# Patient Record
Sex: Female | Born: 1995 | Race: Black or African American | Hispanic: No | Marital: Single | Smoking: Never smoker
Health system: Southern US, Community
[De-identification: ages and names within clinical notes are randomized; demographics above are authoritative.]

## PROBLEM LIST (undated history)

## (undated) DIAGNOSIS — R200 Anesthesia of skin: Secondary | ICD-10-CM

## (undated) DIAGNOSIS — J302 Other seasonal allergic rhinitis: Secondary | ICD-10-CM

## (undated) DIAGNOSIS — R531 Weakness: Secondary | ICD-10-CM

## (undated) DIAGNOSIS — D649 Anemia, unspecified: Secondary | ICD-10-CM

## (undated) DIAGNOSIS — M549 Dorsalgia, unspecified: Secondary | ICD-10-CM

## (undated) DIAGNOSIS — R519 Headache, unspecified: Secondary | ICD-10-CM

## (undated) DIAGNOSIS — R569 Unspecified convulsions: Secondary | ICD-10-CM

## (undated) DIAGNOSIS — R51 Headache: Secondary | ICD-10-CM

## (undated) DIAGNOSIS — T7840XA Allergy, unspecified, initial encounter: Secondary | ICD-10-CM

## (undated) DIAGNOSIS — F419 Anxiety disorder, unspecified: Secondary | ICD-10-CM

## (undated) DIAGNOSIS — J329 Chronic sinusitis, unspecified: Secondary | ICD-10-CM

## (undated) DIAGNOSIS — M419 Scoliosis, unspecified: Secondary | ICD-10-CM

## (undated) DIAGNOSIS — F329 Major depressive disorder, single episode, unspecified: Secondary | ICD-10-CM

## (undated) DIAGNOSIS — F32A Depression, unspecified: Secondary | ICD-10-CM

## (undated) HISTORY — DX: Anesthesia of skin: R20.0

## (undated) HISTORY — PX: TONSILLECTOMY: SUR1361

## (undated) HISTORY — DX: Headache, unspecified: R51.9

## (undated) HISTORY — DX: Weakness: R53.1

## (undated) HISTORY — DX: Anemia, unspecified: D64.9

## (undated) HISTORY — DX: Unspecified convulsions: R56.9

## (undated) HISTORY — DX: Other seasonal allergic rhinitis: J30.2

## (undated) HISTORY — DX: Dorsalgia, unspecified: M54.9

## (undated) HISTORY — PX: ADENOIDECTOMY: SUR15

## (undated) HISTORY — DX: Anxiety disorder, unspecified: F41.9

## (undated) HISTORY — DX: Major depressive disorder, single episode, unspecified: F32.9

## (undated) HISTORY — DX: Depression, unspecified: F32.A

---
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
## 2018-02-07 ENCOUNTER — Encounter: Payer: Self-pay | Admitting: Cardiovascular Disease

## 2018-02-15 ENCOUNTER — Encounter: Payer: Self-pay | Admitting: Cardiovascular Disease

## 2018-03-13 ENCOUNTER — Ambulatory Visit: Payer: Self-pay | Admitting: Cardiology

## 2018-04-12 ENCOUNTER — Encounter: Payer: Self-pay | Admitting: *Deleted

## 2018-04-12 NOTE — Progress Notes (Signed)
Cardiology Office Note  Date: 04/13/2018   ID: Emily Arroyo, DOB 06/08/1996, MRN [id number]  PCP: [facility]  Consulting Cardiologist: Nona Dell, MD   Chief Complaint  Patient presents with  . Palpitations    History of Present Illness: Emily Arroyo is a 22 y.o. female referred for cardiology consultation by Ms. Jean Rosenthal PA-C for the evaluation of palpitations. She went to [employer] is and is now a Gaffer at [employer].  In fact, she is going back to [place] tomorrow.  She states that she has felt a sense of increased heartbeat, usually at nighttime when she is laying down, not noticeable when she is up walking around or doing yoga.  According to her Fitbit, her heart rate can be anywhere from the 90s to the low 110s.  Symptoms last for about 10 or 20 minutes and then resolve on their own.  This occurs perhaps twice weekly at this point.  She has never had any associated syncope, no chest pain, no dyspnea on exertion.  She does not report any family history of cardiac arrhythmias or sudden cardiac death.  I personally reviewed her ECG today which shows sinus rhythm with nonspecific T wave changes, normal QT interval, no delta waves.  She does not report any new medications, does not use any stimulants or energy drinks.  Denies any illicit substance use.  Past Medical History:  Diagnosis Date  . Allergy   . Anxiety and depression   . Headache(784.0)   . Scoliosis   . Sinusitis     Past Surgical History:  Procedure Laterality Date  . ADENOIDECTOMY    . TONSILLECTOMY      Current Outpatient Medications  Medication Sig Dispense Refill  . fluticasone (FLONASE) 50 MCG/ACT nasal spray Place 2 sprays into both nostrils daily.     No current facility-administered medications for this visit.    Allergies:  Zithromax [azithromycin]; Red dye; and Robaxin [methocarbamol]     Social History: The patient  reports that she has never smoked. She has never used smokeless tobacco. She reports that she drinks alcohol. She reports that she does not use drugs.   Family History: The patient's family history includes Arthritis in her maternal grandmother; Cancer in her maternal grandmother; Hearing loss in her maternal grandmother; Heart disease in her maternal grandmother; Hyperlipidemia in her maternal grandfather and maternal grandmother; Hypertension in her maternal grandmother and mother.   ROS:  Please see the history of present illness. Otherwise, complete review of systems is positive for none.  All other systems are reviewed and negative.   Physical Exam: VS:  BP 116/78 (BP Location: Right Arm)   Pulse (!) 101   Ht 5\' 2"  (1.575 m)   Wt 127 lb (57.6 kg)   SpO2 97%   BMI 23.23 kg/m , BMI Body mass index is 23.23 kg/m.  Wt Readings from Last 3 Encounters:  04/13/18 127 lb (57.6 kg)  09/29/12 107 lb (48.5 kg) (16 %, Z= -1.01)*  09/26/12 107 lb 9.6 oz (48.8 kg) (17 %, Z= -0.97)*   * Growth percentiles are based on CDC (Girls, 2-20 Years) data.    General: Patient appears comfortable at rest. HEENT: Conjunctiva and lids normal, oropharynx clear. Neck: Supple, no elevated JVP or carotid bruits, no thyromegaly. Lungs: Clear to auscultation, nonlabored breathing at rest. Cardiac: Regular rate and rhythm, no S3, 2/6 mid-to-late systolic murmur without obvious click, no  pericardial rub. Abdomen: Soft, nontender, bowel sounds present. Extremities: No pitting edema, distal pulses 2+. Skin: Warm and dry. Musculoskeletal: No kyphosis. Neuropsychiatric: Alert and oriented x3, affect grossly appropriate.  ECG: I personally reviewed the tracing from 09/29/2012 which showed sinus tachycardia with nonspecific ST-T changes.  Recent Labwork:    Component Value Date/Time   CHOL 114 10/08/2012 0615   TRIG 36 10/08/2012 0615   HDL 61 10/08/2012 0615   CHOLHDL 1.9  10/08/2012 0615   VLDL 7 10/08/2012 0615   LDLCALC 46 10/08/2012 0615  July 2019: Hemoglobin 11.3, platelets 332, BUN 9, creatinine 0.76, potassium 4.4, AST 12, ALT less than 8, cholesterol 151, triglycerides 46, HDL 78, LDL 64  Assessment and Plan:  1.  Intermittent palpitations as described above, no associated syncope, no exertional chest pain or shortness of breath.  Symptom frequency is typically twice a week at this point.  She is not on any new medications, reports no stimulant use or illicit substance use.  No history of thyroid disease or family history of recurring arrhythmias.  Would generally recommend a 7-day event monitor at this time to try to capture what she is experiencing.  Unfortunately, she is heading back to [place] tomorrow.  I have recommended that she follow-up with a healthcare provider in that area and discuss cardiac monitoring.  2.  Mid to late systolic murmur without click.  In light of her symptoms, would recommend an echocardiogram to assess cardiac structure and function.  She reports no history of mitral valve prolapse.  We will try to get the echocardiogram arranged today before she leaves town tomorrow.  Current medicines were reviewed with the patient today.   Orders Placed This Encounter  Procedures  . EKG 12-Lead  . ECHOCARDIOGRAM COMPLETE    Disposition: Call with test results.  Signed, Jonelle Sidle, MD, [facility] 04/13/2018 11:24 AM    [facility] at [facility] [address] [place], [place] [address] Phone: [phone]; Fax: [phone]

## 2018-04-13 ENCOUNTER — Encounter: Payer: Self-pay | Admitting: Cardiology

## 2018-04-13 ENCOUNTER — Ambulatory Visit (INDEPENDENT_AMBULATORY_CARE_PROVIDER_SITE_OTHER): Payer: 59 | Admitting: Cardiology

## 2018-04-13 ENCOUNTER — Ambulatory Visit (HOSPITAL_COMMUNITY)
Admission: RE | Admit: 2018-04-13 | Discharge: 2018-04-13 | Disposition: A | Discharge status: Home / Self Care | Payer: 59 | Source: Ambulatory Visit | Attending: Cardiology | Admitting: Cardiology

## 2018-04-13 VITALS — BP 116/78 | HR 101 | Ht 62.0 in | Wt 127.0 lb

## 2018-04-13 DIAGNOSIS — R002 Palpitations: Secondary | ICD-10-CM | POA: Diagnosis not present

## 2018-04-13 DIAGNOSIS — R011 Cardiac murmur, unspecified: Secondary | ICD-10-CM | POA: Diagnosis present

## 2018-04-13 DIAGNOSIS — F411 Generalized anxiety disorder: Secondary | ICD-10-CM | POA: Diagnosis not present

## 2018-04-13 DIAGNOSIS — F329 Major depressive disorder, single episode, unspecified: Secondary | ICD-10-CM | POA: Insufficient documentation

## 2018-04-13 LAB — ECHOCARDIOGRAM COMPLETE
Height: 62 in
WEIGHTICAEL: 2032 [oz_av]

## 2018-04-13 NOTE — Patient Instructions (Signed)
Your physician recommends that you schedule a follow-up appointment in:  To be determined after echo    Your physician has requested that you have an echocardiogram. Echocardiography is a painless test that uses sound waves to create images of your heart. It provides your doctor with information about the size and shape of your heart and how well your heart's chambers and valves are working. This procedure takes approximately one hour. There are no restrictions for this procedure.       Your physician recommends that you continue on your current medications as directed. Please refer to the Current Medication list given to you today.     No lab work today       Thank you for [facility] !

## 2018-04-13 NOTE — Progress Notes (Signed)
*  PRELIMINARY RESULTS* Echocardiogram 2D Echocardiogram has been performed.  Emily Arroyo 04/13/2018, 12:30 PM

## 2018-05-16 ENCOUNTER — Telehealth: Payer: Self-pay | Admitting: Cardiology

## 2018-05-16 NOTE — Telephone Encounter (Signed)
Please give pt a call concerning a monitor   [phone]

## 2018-05-17 NOTE — Telephone Encounter (Signed)
Attempted to reach,[id number] 

## 2018-05-18 NOTE — Telephone Encounter (Signed)
Called pt. Non answer, left message for pt to return call concerning monitor.

## 2019-11-08 ENCOUNTER — Ambulatory Visit (INDEPENDENT_AMBULATORY_CARE_PROVIDER_SITE_OTHER): Payer: Self-pay | Admitting: Internal Medicine

## 2019-12-10 ENCOUNTER — Encounter (INDEPENDENT_AMBULATORY_CARE_PROVIDER_SITE_OTHER): Payer: Self-pay | Admitting: Internal Medicine

## 2019-12-10 ENCOUNTER — Ambulatory Visit (INDEPENDENT_AMBULATORY_CARE_PROVIDER_SITE_OTHER): Payer: 59 | Admitting: Internal Medicine

## 2019-12-10 VITALS — BP 123/76 | HR 85 | Temp 98.4°F | Resp 18 | Ht 62.0 in | Wt 134.6 lb

## 2019-12-10 DIAGNOSIS — Z0000 Encounter for general adult medical examination without abnormal findings: Secondary | ICD-10-CM

## 2019-12-10 DIAGNOSIS — Z87898 Personal history of other specified conditions: Secondary | ICD-10-CM | POA: Insufficient documentation

## 2019-12-10 DIAGNOSIS — J302 Other seasonal allergic rhinitis: Secondary | ICD-10-CM | POA: Insufficient documentation

## 2019-12-10 MED ORDER — AZELASTINE HCL 0.1 % NA SOLN
2.00 | Freq: Two times a day (BID) | NASAL | 1 refills | Status: DC
Start: 2019-12-10 — End: 2021-05-03

## 2019-12-10 NOTE — Progress Notes (Signed)
Subjective:      Date: 12/10/2019 12:39 PM   Patient ID: Sarah Ball is a 24 y.o. female.    Chief Complaint:  Chief Complaint   Patient presents with    Annual Exam     not fasting       HPI  Visit Type: Health Maintenance Visit  Work Status: working full-time  Reported Health: good health   Reported Diet: vegetarian x4-52yrs  Reported Exercise: 4x/week  Dental: regular dental visits twice a year  Vision: glasses, contact lenses and regular eye exams   Hearing: normal hearing  Immunization Status: immunizations up to date  Reproductive Health: not currently sexually active and premenopausal  Prior Screening Tests: last pap smear in 2019  General Health Risks: family history of prostate cancer, no family history of colon cancer and family history of breast cancer  Safety Elements Used: uses seat belts and smoke detectors in household  Reports seasonal allergies and that she takes Flonase which has in the past worked well for her but no longer working.  Reports she only takes Claritin/antihistamines as needed.  Problem List:  Patient Active Problem List   Diagnosis    Seasonal allergies    History of palpitations       Current Medications:  Current Outpatient Medications   Medication Sig Dispense Refill    UNABLE TO FIND fluticasone Metered Dose Nasal Spray [Flonase] 2 sprays in each nostril daily      azelastine (ASTELIN) 0.1 % nasal spray 2 sprays by Nasal route 2 (two) times daily 2 sprays each nostril twice a day as needed 30 mL 1     No current facility-administered medications for this visit.       Allergies:  Allergies   Allergen Reactions    Azithromycin Anaphylaxis    Phenytoin Anaphylaxis    Other Nausea And Vomiting and Headaches     Red Dye    Magnesium Rash       Past Medical History:  Past Medical History:   Diagnosis Date    Anti-NMDA receptor encephalitis 2014       Past Surgical History:  Past Surgical History:   Procedure Laterality Date    TONSILLECTOMY         Family History:  Family  History   Problem Relation Age of Onset    Hypertension Mother     Cancer Maternal Grandmother         breast ca    Hypertension Maternal Grandmother     Cancer Maternal Grandfather         prostate    Hypertension Maternal Grandfather        Social History:  Social History     Socioeconomic History    Marital status: Single     Spouse name: Not on file    Number of children: Not on file    Years of education: Not on file    Highest education level: Not on file   Occupational History    Not on file   Tobacco Use    Smoking status: Never Smoker    Smokeless tobacco: Never Used   Substance and Sexual Activity    Alcohol use: Yes     Comment: socially    Drug use: Never    Sexual activity: Not Currently   Other Topics Concern    Not on file   Social History Narrative    Not on file     Social Determinants of Health  Financial Resource Strain:     Difficulty of Paying Living Expenses:    Food Insecurity:     Worried About Programme researcher, broadcasting/film/video in the Last Year:     Barista in the Last Year:    Transportation Needs:     Freight forwarder (Medical):     Lack of Transportation (Non-Medical):    Physical Activity:     Days of Exercise per Week:     Minutes of Exercise per Session:    Stress:     Feeling of Stress :    Social Connections:     Frequency of Communication with Friends and Family:     Frequency of Social Gatherings with Friends and Family:     Attends Religious Services:     Active Member of Clubs or Organizations:     Attends Banker Meetings:     Marital Status:    Intimate Partner Violence:     Fear of Current or Ex-Partner:     Emotionally Abused:     Physically Abused:     Sexually Abused:        The following sections were reviewed this encounter by the provider:   Tobacco   Allergies   Meds   Problems   Med Hx   Surg Hx   Fam Hx          ROS:   General/Constitutional:   Denies Change in appetite. Denies Chills. Denies Fatigue. Denies Fever.    Ophthalmologic:   Denies Blurred vision. Denies Eye Discharge. Denies Eye Pain.   ENT:   Denies Nasal Discharge. Denies Hoarseness. Denies Ear pain. Denies Nosebleed. Denies Sinus pain. Denies Sore throat.   Endocrine:   Denies Polydipsia. Denies Polyuria. Denies Weakness.   Respiratory:   Denies Paroxysmal Nocturnal Dyspnea. Denies Cough. Denies Orthopnea. Denies Shortness of breath. Denies Wheezing.   Cardiovascular:   Denies Chest pain. Denies Chest pain with exertion. Denies Leg Claudication.. Denies Swelling in hands/feet.   Gastrointestinal:   Denies Abdominal pain. Denies Blood in stool. Denies Constipation. Denies Diarrhea. Denies Heartburn. Denies Nausea. Denies Vomiting.   Hematology:   Denies Easy bruising. Denies Easy Bleeding. Denies Swollen glands.   Genitourinary:   Denies Blood in urine. Denies Nocturia. Denies Painful urination.   Musculoskeletal:   Denies Joint pain. Denies Joint stiffness. Denies Leg cramps. Denies Muscle aches. Denies Weakness in LE. Denies Swollen joints. Denies Weakness in UE.   Skin:   Denies Itching.Denies Rash.   Neurologic:   Denies Balance difficulty. Denies Dizziness. Denies Gait abnormality. Denies Headache. Denies Pre-Syncope. Denies Memory loss. Denies Seizures. Denies Tingling/Numbness.   Psychiatric:   Denies Anxiety. Denies Depressed mood. Denies Difficulty sleeping.     Objective:     Vitals:  BP 123/76 (BP Site: Right arm, Patient Position: Sitting, Cuff Size: Medium)    Pulse 85    Temp 98.4 F (36.9 C) (Oral)    Resp 18    Ht 1.575 m (5\' 2" )    Wt 61.1 kg (134 lb 9.6 oz)    LMP 11/28/2019 (Exact Date)    SpO2 99%    BMI 24.62 kg/m     Examination:   General Examination:   GENERAL APPEARANCE: alert, in no acute distress, well developed, well nourished, oriented to time, place, and person.   HEAD: normal appearance, atraumatic.   EYES: extraocular movement intact (EOMI), pupils equal, round, reactive to light and accommodation, sclera anicteric, conjunctiva  clear.  EARS: tympanic membranes normal bilaterally, external canals normal .   NOSE: nares patent..   ORAL CAVITY: normal oropharynx, normal lips, mucosa moist, no lesions.   THROAT: normal appearance, clear, no erythema.   NECK/THYROID: neck supple, no carotid bruit, carotid pulse 2+ bilaterally, no cervical lymphadenopathy, no neck mass palpated, no jugular venous distention, no thyromegaly.   LYMPH NODES: no palpable adenopathy.   SKIN: good turgor, no rashes, no suspicious lesions.   HEART: S1, S2 normal, no murmurs, rubs, gallops, regular rate and rhythm.   LUNGS: normal effort / no distress, normal breath sounds, clear to auscultation bilaterally, no wheezes, rales, rhonchi.   ABDOMEN: bowel sounds present, no hepatosplenomegaly, soft, nontender, nondistended.   FEMALE GENITOURINARY: Deferred, up-to-date  MUSCULOSKELETAL: full range of motion, no swelling or deformity.   EXTREMITIES: no edema, no clubbing, cyanosis, or edema.   PERIPHERAL PULSES: 2+ dorsalis pedis, 2+ posterior tibial.   NEUROLOGIC: nonfocal, cranial nerves 2-12 grossly intact, deep tendon reflexes 2+ symmetrical, normal strength, tone and reflexes, sensory exam intact.   PSYCH: cognitive function intact, mood/affect full range, speech clear.     Assessment/Plan:       1. Well adult exam  - CBC and differential; Future  - Comprehensive metabolic panel; Future  - TSH; Future  - T4, free; Future  - Lipid panel; Future  - Vitamin B12; Future  - Hemoglobin A1C; Future  - Vitamin D,25 OH, Total; Future    2. Seasonal allergies  - azelastine (ASTELIN) 0.1 % nasal spray; 2 sprays by Nasal route 2 (two) times daily 2 sprays each nostril twice a day as needed  Dispense: 30 mL; Refill: 1      Health Maintenance:  Immunizations UTD. Vision screening UTD. Dental Screening UTD. Cervical cancer screening is UTD.    Return for schedule follow up after labs as discussed, annual in 59yr.Kela Millin, MD

## 2019-12-10 NOTE — Patient Instructions (Signed)
It was a pleasure seeing you at the office today!    Prevention Guidelines,Women Ages 18 to 39  Screening tests and vaccines are an important part of managing your health. A screening test is done to find possible disorders or diseases in people who don't have any symptoms. The goal is to find a disease early so lifestyle changes can be made and you can be watched more closely to reduce the risk of disease, or to detect it early enough to treat it most effectively. Screening tests are not considered diagnostic, but are used to determine if more testing is needed. Health counseling is essential, too. Below are guidelines for these, for women ages 18 to 39. Talk with your healthcare provider to make sure you're up-to-date on what you need.   Screening Who needs it How often   Alcohol misuse All women in this age group  At routine exams   Blood pressure All women in this age group  Yearly checkup if your blood pressure is normal   Normal blood pressure is less than 120/80 mm Hg   If your blood pressure reading is higher than normal, follow the advice of your healthcare provider    Breast cancer All women in this age group should talk with their healthcare providers about the need for clinical breast exams (CBE)1  Clinical breast exam every 3 years1    Cervical cancer Women ages 21 and older  Women between ages 21 and 29 should have a Pap test every 3 years; women between ages 30 and 65 are advised to have a Pap test plus an HPV test every 5 years    Chlamydia Sexually active women ages 24 and younger, and women at increased risk for infection  Every 3 years if you're at risk or have symptoms    Depression All women in this age group  At routine exams   Diabetes mellitus, type 2  Adults with no symptoms who are overweight or obese and have 1 or more other risk factors for diabetes  At least every 3 years. Also, testing for diabetes during pregnancy after the 24th week.    Gonorrhea Sexually active women at increased  risk for infection  At routine exams   Hepatitis C Anyone at increased risk  At routine exams   HIV All women At routine exams3    Obesity All women in this age group  At routine exams   Syphilis Women at increased risk for infection should talk with their healthcare provider  At routine exams   Tuberculosis Women at increased risk for infection should talk with their healthcare provider  Ask your healthcare provider    Vision All women in this age group  At least 1 complete exam in your 20s, and 2 in your 30s    Vaccine Who needs it How often   Chickenpox (varicella)  All women in this age group who have no record of this infection or vaccine  2 doses; the second dose should be given 4 to 8 weeks after the first dose    Hepatitis A Women at increased risk for infection should talk with their healthcare provider  2 doses given at least 6 months apart    Hepatitis B Women at increased risk for infection should talk with their healthcare provider  3 doses over 6 months; second dose should be given 1 month after the first dose; the third dose should be given at least 2 months after the second dose   and at least 4 months after the first dose    Haemophilus influenzae Type B (HIB)  Women at increased risk for infection should talk with their healthcare provider  1 to 3 doses   Human papillomavirus (HPV)  All women in this age group up to age 26  3 doses; the second dose should be given 1 to 2 months after the first dose and the third dose given 6 months after the first dose    Influenza (flu) All women in this age group  Once a year   Measles, mumps, rubella (MMR)  All women in this age group who have no record of these infections or vaccines  1 or 2 doses   Meningococcal Women at increased risk for infection should talk with their healthcare provider  1 or more doses   Pneumococcal conjugate vaccine (PCV13)and pneumococcal polysaccharidevaccine(PPSV23)  Women at increased risk for infection should talk with their  healthcare provider  PCV13: 1 dose ages 19 to 65 (protects against 13 types of pneumococcal bacteria)   PPSV23: 1 to2 doses through age 64, or 1 dose at 65 or older (protects against 23 types of pneumococcal bacteria)    Tetanus/diphtheria/pertussis (Td/Tdap) booster All women in this age group  Td every 10 years, or a one-time dose of Tdap instead of a Td booster after age 18, then Td every 10 years    Counseling Who needs it How often   BRCA gene mutation testing for breast and ovarian cancer susceptibility  Women with increased risk for having gene mutation  When your risk is known    Breast cancer and chemoprevention  Women at high risk for breast cancer  When your risk is known    Diet and exercise Women who are overweight or obese  When diagnosed, and then at routine exams    Domestic violence Women at the age in which they are able to have children  At routine exams   Sexually transmitted infection prevention  Women who are sexually active  At routine exams   Skin cancer Prevention of skin cancer in fair-skinned adults  At routine exams   Use of tobacco and the health effects it can cause  All women in this age group  Every visit   1 According to the ACS, women ages 20 to 39 years should have a clinical breast exam (CBE) as part of their routine health exam every 3 years. Breast self-exams are an option for women starting in their 20s.But the U.S. Preventive Services Task Force (USPSTF) does not recommend CBE.   2 Those who are 25 years old and not up-to-date on their childhood vaccines should get all appropriate catch-up vaccines recommended by the CDC.   3 The USPSTF recommends that all people ages 15 to 65 years be screened for HIV and those younger or older people at increased risk. The CDC recommends that everyone between the ages of 13 and 64 get tested for HIV at least once as part of routine health care.   [employer] last reviewed this educational content on 04/17/2016   2000-2020 [employer] [address], [place] [address]. All rights reserved. This information is not intended as a substitute for professional medical care. Always follow your healthcare professional's instructions.

## 2019-12-10 NOTE — Progress Notes (Signed)
Have you seen any specialists/other providers since your last visit with us?    New patient    Arm preference verified?   Yes    The patient is due for nothing at this time, HM is up-to-date.

## 2019-12-13 ENCOUNTER — Other Ambulatory Visit (FREE_STANDING_LABORATORY_FACILITY): Payer: 59

## 2019-12-13 DIAGNOSIS — Z0000 Encounter for general adult medical examination without abnormal findings: Secondary | ICD-10-CM

## 2019-12-13 LAB — COMPREHENSIVE METABOLIC PANEL
ALT: 9 U/L (ref 0–55)
AST (SGOT): 13 U/L (ref 5–34)
Albumin/Globulin Ratio: 1.4 (ref 0.9–2.2)
Albumin: 4.1 g/dL (ref 3.5–5.0)
Alkaline Phosphatase: 52 U/L (ref 37–106)
Anion Gap: 10 (ref 5.0–15.0)
BUN: 8 mg/dL (ref 7.0–19.0)
Bilirubin, Total: 0.6 mg/dL (ref 0.2–1.2)
CO2: 23 mEq/L (ref 21–29)
Calcium: 9.5 mg/dL (ref 8.5–10.5)
Chloride: 106 mEq/L (ref 100–111)
Creatinine: 0.8 mg/dL (ref 0.4–1.5)
Globulin: 2.9 g/dL (ref 2.0–3.7)
Glucose: 88 mg/dL (ref 70–100)
Potassium: 4.1 mEq/L (ref 3.5–5.1)
Protein, Total: 7 g/dL (ref 6.0–8.3)
Sodium: 139 mEq/L (ref 136–145)

## 2019-12-13 LAB — CBC AND DIFFERENTIAL
Absolute NRBC: 0 10*3/uL (ref 0.00–0.00)
Basophils Absolute Automated: 0.04 10*3/uL (ref 0.00–0.08)
Basophils Automated: 0.8 %
Eosinophils Absolute Automated: 0.02 10*3/uL (ref 0.00–0.44)
Eosinophils Automated: 0.4 %
Hematocrit: 35.9 % (ref 34.7–43.7)
Hgb: 10.8 g/dL — ABNORMAL LOW (ref 11.4–14.8)
Immature Granulocytes Absolute: 0.01 10*3/uL (ref 0.00–0.07)
Immature Granulocytes: 0.2 %
Lymphocytes Absolute Automated: 1.62 10*3/uL (ref 0.42–3.22)
Lymphocytes Automated: 32.5 %
MCH: 25.1 pg (ref 25.1–33.5)
MCHC: 30.1 g/dL — ABNORMAL LOW (ref 31.5–35.8)
MCV: 83.5 fL (ref 78.0–96.0)
MPV: 11.3 fL (ref 8.9–12.5)
Monocytes Absolute Automated: 0.29 10*3/uL (ref 0.21–0.85)
Monocytes: 5.8 %
Neutrophils Absolute: 3 10*3/uL (ref 1.10–6.33)
Neutrophils: 60.3 %
Nucleated RBC: 0 /100 WBC (ref 0.0–0.0)
Platelets: 369 10*3/uL — ABNORMAL HIGH (ref 142–346)
RBC: 4.3 10*6/uL (ref 3.90–5.10)
RDW: 15 % (ref 11–15)
WBC: 4.98 10*3/uL (ref 3.10–9.50)

## 2019-12-13 LAB — HEMOLYSIS INDEX: Hemolysis Index: 7 (ref 0–18)

## 2019-12-13 LAB — LIPID PANEL
Cholesterol / HDL Ratio: 2.2
Cholesterol: 151 mg/dL (ref 0–199)
HDL: 70 mg/dL (ref 40–9999)
LDL Calculated: 74 mg/dL (ref 0–99)
Triglycerides: 36 mg/dL (ref 34–149)
VLDL Calculated: 7 mg/dL — ABNORMAL LOW (ref 10–40)

## 2019-12-13 LAB — VITAMIN B12: Vitamin B-12: 504 pg/mL (ref 211–911)

## 2019-12-13 LAB — HEMOGLOBIN A1C
Average Estimated Glucose: 102.5 mg/dL
Hemoglobin A1C: 5.2 % (ref 4.6–5.9)

## 2019-12-13 LAB — VITAMIN D,25 OH,TOTAL: Vitamin D, 25 OH, Total: 34 ng/mL (ref 30–100)

## 2019-12-13 LAB — GFR: EGFR: >60

## 2019-12-13 LAB — TSH: TSH: 0.87 u[IU]/mL (ref 0.35–4.94)

## 2019-12-13 LAB — T4, FREE: T4 Free: 1.06 ng/dL (ref 0.70–1.48)

## 2019-12-23 ENCOUNTER — Ambulatory Visit (INDEPENDENT_AMBULATORY_CARE_PROVIDER_SITE_OTHER): Payer: 59 | Admitting: Internal Medicine

## 2019-12-23 ENCOUNTER — Encounter (INDEPENDENT_AMBULATORY_CARE_PROVIDER_SITE_OTHER): Payer: Self-pay | Admitting: Internal Medicine

## 2019-12-23 ENCOUNTER — Encounter (INDEPENDENT_AMBULATORY_CARE_PROVIDER_SITE_OTHER): Payer: Self-pay

## 2019-12-23 ENCOUNTER — Telehealth (INDEPENDENT_AMBULATORY_CARE_PROVIDER_SITE_OTHER): Payer: 59 | Admitting: Internal Medicine

## 2019-12-23 DIAGNOSIS — D649 Anemia, unspecified: Secondary | ICD-10-CM

## 2019-12-23 DIAGNOSIS — N946 Dysmenorrhea, unspecified: Secondary | ICD-10-CM

## 2019-12-23 DIAGNOSIS — R002 Palpitations: Secondary | ICD-10-CM

## 2019-12-23 MED ORDER — VITRON-C 65-125 MG PO TABS
1.0000 | ORAL_TABLET | Freq: Two times a day (BID) | ORAL | 0 refills | Status: DC
Start: 2019-12-23 — End: 2021-01-12

## 2019-12-23 NOTE — Progress Notes (Addendum)
Have you seen any specialists/other providers since your last visit with us?    No        The patient is due for   PAP SMEAR  HPV Series (1 - 2-dose series)

## 2019-12-23 NOTE — Progress Notes (Signed)
Subjective:      Date: 12/23/2019 2:45 PM   Patient ID: Sarah Ball is a 24 y.o. female.    Chief Complaint:  Chief Complaint   Patient presents with    follow up to labs       HPI:  HPI   Verbal consent has been obtained from the patient to conduct a video and telephone visit to minimize exposure to COVID-19: yes  Patient is a 24 year old female presenting for follow-up of intermittent palpitations and results.  She reports that her periods  Usually 6 days long, and that when she was younger she was told she had anemia but never 'formally' diagnosed.  She admits to cramping with her periods. She states she does not take any supplements.  She denies dizziness chest pain and no shortness of breath.    Problem List:  Patient Active Problem List   Diagnosis    Seasonal allergies    History of palpitations       Current Medications:  Outpatient Medications Marked as Taking for the 12/23/19 encounter (Telemedicine Visit) with Kela Millin, MD   Medication Sig Dispense Refill    UNABLE TO FIND fluticasone Metered Dose Nasal Spray [Flonase] 2 sprays in each nostril daily         Allergies:  Allergies   Allergen Reactions    Azithromycin Anaphylaxis    Phenytoin Anaphylaxis    Other Nausea And Vomiting and Headaches     Red Dye    Magnesium Rash    Red Dye Nausea And Vomiting and Other (See Comments)       Past Medical History:  Past Medical History:   Diagnosis Date    Anti-NMDA receptor encephalitis 2014       Past Surgical History:  Past Surgical History:   Procedure Laterality Date    TONSILLECTOMY         Family History:  Family History   Problem Relation Age of Onset    Hypertension Mother     Cancer Maternal Grandmother         breast ca    Hypertension Maternal Grandmother     Cancer Maternal Grandfather         prostate    Hypertension Maternal Grandfather        Social History:  Social History     Socioeconomic History    Marital status: Single     Spouse name: Not on file    Number of  children: Not on file    Years of education: Not on file    Highest education level: Not on file   Occupational History    Not on file   Tobacco Use    Smoking status: Never Smoker    Smokeless tobacco: Never Used   Substance and Sexual Activity    Alcohol use: Yes     Comment: socially    Drug use: Never    Sexual activity: Not Currently   Other Topics Concern    Not on file   Social History Narrative    Not on file     Social Determinants of Health     Financial Resource Strain:     Difficulty of Paying Living Expenses:    Food Insecurity:     Worried About Radiation protection practitioner of Food in the Last Year:     Barista in the Last Year:    Transportation Needs:     Freight forwarder (Medical):  Lack of Transportation (Non-Medical):    Physical Activity:     Days of Exercise per Week:     Minutes of Exercise per Session:    Stress:     Feeling of Stress :    Social Connections:     Frequency of Communication with Friends and Family:     Frequency of Social Gatherings with Friends and Family:     Attends Religious Services:     Active Member of Clubs or Organizations:     Attends Banker Meetings:     Marital Status:    Intimate Partner Violence:     Fear of Current or Ex-Partner:     Emotionally Abused:     Physically Abused:     Sexually Abused:        The following sections were reviewed this encounter by the provider:   Tobacco   Allergies   Meds   Problems   Med Hx   Surg Hx   Fam Hx          ROS:  General/Constitutional:   Denies Chills. Denies Fatigue. Denies Fever.   Ophthalmologic:   Denies Blurred vision. Denies Eye Pain.   ENT:   Denies Nasal Discharge. Denies Ear pain. Denies Sinus pain.   Endocrine:   Denies Polydipsia. Denies Polyuria.   Respiratory:   Denies Cough. Denies Orthopnea. Denies Shortness of breath. Denies Wheezing.   Cardiovascular:   Denies Chest pain. Denies Chest pain with exertion. Denies Leg Claudication. Denies Palpitations. Denies Swelling  in hands/feet.   Gastrointestinal:    As above. Denies Blood in stool. Denies Constipation. Denies Diarrhea. Denies Heartburn. Denies Nausea. Denies Vomiting.   Genitourinary:   Denies Blood in urine. Denies Frequent urination. Denies Painful urination.   Musculoskeletal:   Denies Leg cramps. Denies Muscle aches.   Skin:   Denies Skin lesion(s).   Neurologic:   Denies Dizziness. Denies Gait abnormality. Denies Headache. Denies Tingling/Numbness.     Objective:   Vitals:  LMP 11/28/2019 (Exact Date)       Physical Exam:  General Examination:   Physical Exam   GENERAL APPEARANCE: alert, in no acute distress, well developed, well nourished  oriented to time, place, and person.   PSYCH: alert and oriented to time,place and person.   SKIN: moist, no focal rash  Recent Results (from the past 1008 hour(s))   CBC and differential    Collection Time: 12/13/19  9:42 AM   Result Value Ref Range    WBC 4.98 3.10 - 9.50 x10 3/uL    Hgb 10.8 (L) 11.4 - 14.8 g/dL    Hematocrit 16.1 09.6 - 43.7 %    Platelets 369 (H) 142 - 346 x10 3/uL    RBC 4.30 3.90 - 5.10 x10 6/uL    MCV 83.5 78.0 - 96.0 fL    MCH 25.1 25.1 - 33.5 pg    MCHC 30.1 (L) 31.5 - 35.8 g/dL    RDW 15 11 - 15 %    MPV 11.3 8.9 - 12.5 fL    Neutrophils 60.3 None %    Lymphocytes Automated 32.5 None %    Monocytes 5.8 None %    Eosinophils Automated 0.4 None %    Basophils Automated 0.8 None %    Immature Granulocytes 0.2 None %    Nucleated RBC 0.0 0.0 - 0.0 /100 WBC    Neutrophils Absolute 3.00 1.10 - 6.33 x10 3/uL    Lymphocytes Absolute Automated 1.62 0.42 -  3.22 x10 3/uL    Monocytes Absolute Automated 0.29 0.21 - 0.85 x10 3/uL    Eosinophils Absolute Automated 0.02 0.00 - 0.44 x10 3/uL    Basophils Absolute Automated 0.04 0.00 - 0.08 x10 3/uL    Immature Granulocytes Absolute 0.01 0.00 - 0.07 x10 3/uL    Absolute NRBC 0.00 0.00 - 0.00 x10 3/uL   Comprehensive metabolic panel    Collection Time: 12/13/19  9:42 AM   Result Value Ref Range    Glucose 88 70 - 100 mg/dL     BUN 8.0 7.0 - 16.1 mg/dL    Creatinine 0.8 0.4 - 1.5 mg/dL    Sodium 096 045 - 409 mEq/L    Potassium 4.1 3.5 - 5.1 mEq/L    Chloride 106 100 - 111 mEq/L    CO2 23 21 - 29 mEq/L    Calcium 9.5 8.5 - 10.5 mg/dL    Protein, Total 7.0 6.0 - 8.3 g/dL    Albumin 4.1 3.5 - 5.0 g/dL    AST (SGOT) 13 5 - 34 U/L    ALT 9 0 - 55 U/L    Alkaline Phosphatase 52 37 - 106 U/L    Bilirubin, Total 0.6 0.2 - 1.2 mg/dL    Globulin 2.9 2.0 - 3.7 g/dL    Albumin/Globulin Ratio 1.4 0.9 - 2.2    Anion Gap 10.0 5.0 - 15.0   TSH    Collection Time: 12/13/19  9:42 AM   Result Value Ref Range    TSH 0.87 0.35 - 4.94 uIU/mL   T4, free    Collection Time: 12/13/19  9:42 AM   Result Value Ref Range    T4 Free 1.06 0.70 - 1.48 ng/dL   Lipid panel    Collection Time: 12/13/19  9:42 AM   Result Value Ref Range    Cholesterol 151 0 - 199 mg/dL    Triglycerides 36 34 - 149 mg/dL    HDL 70 40 - 8,119 mg/dL    LDL Calculated 74 0 - 99 mg/dL    VLDL Calculated 7 (L) 10 - 40 mg/dL    Cholesterol / HDL Ratio 2.2 See Below   Vitamin B12    Collection Time: 12/13/19  9:42 AM   Result Value Ref Range    Vitamin B-12 504 211 - 911 pg/mL   Hemoglobin A1C    Collection Time: 12/13/19  9:42 AM   Result Value Ref Range    Hemoglobin A1C 5.2 4.6 - 5.9 %    Average Estimated Glucose 102.5 mg/dL   Vitamin J,47 OH, Total    Collection Time: 12/13/19  9:42 AM   Result Value Ref Range    Vitamin D, 25 OH, Total 34 30 - 100 ng/mL   Hemolysis index    Collection Time: 12/13/19  9:42 AM   Result Value Ref Range    Hemolysis Index 7 0 - 18   GFR    Collection Time: 12/13/19  9:42 AM   Result Value Ref Range    EGFR >60.0        Assessment:       1. Normocytic anemia  - iron-vitamin C (Vitron-C) 65-125 MG Tab; Take 1 tablet by mouth 2 (two) times daily  Dispense: 180 tablet; Refill: 0  -All above results reviewed with patient and questions answered to her satisfaction.  - US Transvaginal Only Non-OB; Future  - follow up in 6 to 8 weeks for recheck  2.  Dysmenorrhea/metrorrhagia  -  Ambulatory referral to Obstetrics / Gynecology  - US Transvaginal Only Non-OB; Future  3.  Palpitations  -Likely secondary to #1, see as above  -Patient to follow-up as above    Plan:   As above  Return for 6-8weeks for anemia/palpitations.  Time spent in discussion: 25 minutes      Lequan Dobratz Joselyn Glassman, MD

## 2019-12-23 NOTE — Progress Notes (Signed)
Video visit today.

## 2020-01-13 ENCOUNTER — Encounter (INDEPENDENT_AMBULATORY_CARE_PROVIDER_SITE_OTHER): Payer: Self-pay | Admitting: Internal Medicine

## 2020-01-14 NOTE — Progress Notes (Signed)
Spoke to patient, provided central scheduling phone number.

## 2020-01-23 ENCOUNTER — Ambulatory Visit: Payer: 59

## 2020-01-29 ENCOUNTER — Other Ambulatory Visit (INDEPENDENT_AMBULATORY_CARE_PROVIDER_SITE_OTHER): Payer: Self-pay | Admitting: Internal Medicine

## 2020-01-29 ENCOUNTER — Ambulatory Visit
Admission: RE | Admit: 2020-01-29 | Discharge: 2020-01-29 | Disposition: A | Discharge status: Home / Self Care | Payer: 59 | Source: Ambulatory Visit | Attending: Internal Medicine | Admitting: Internal Medicine

## 2020-01-29 DIAGNOSIS — D649 Anemia, unspecified: Secondary | ICD-10-CM | POA: Insufficient documentation

## 2020-01-29 DIAGNOSIS — N946 Dysmenorrhea, unspecified: Secondary | ICD-10-CM

## 2020-02-25 ENCOUNTER — Encounter (HOSPITAL_BASED_OUTPATIENT_CLINIC_OR_DEPARTMENT_OTHER): Payer: Self-pay | Admitting: Obstetrics & Gynecology

## 2020-02-25 ENCOUNTER — Ambulatory Visit (INDEPENDENT_AMBULATORY_CARE_PROVIDER_SITE_OTHER): Payer: 59 | Admitting: Obstetrics & Gynecology

## 2020-02-25 VITALS — BP 127/81 | HR 78 | Temp 98.1°F | Ht 62.0 in | Wt 136.2 lb

## 2020-02-25 DIAGNOSIS — N946 Dysmenorrhea, unspecified: Secondary | ICD-10-CM

## 2020-02-25 DIAGNOSIS — B369 Superficial mycosis, unspecified: Secondary | ICD-10-CM

## 2020-02-25 MED ORDER — NORETHIN ACE-ETH ESTRAD-FE 1-20 MG-MCG(24) PO TABS
1.0000 | ORAL_TABLET | Freq: Every day | ORAL | 0 refills | Status: DC
Start: 2020-02-25 — End: 2020-05-21

## 2020-02-25 NOTE — Progress Notes (Signed)
CC: dysmenorrhea, skin lesions     HPI:   Sarah Ball is a 24 y.o. G0P0000, Patient's last menstrual period was 02/18/2020 (exact date). Here today for dysmenorrhea. Patient reports having moderate-significant cramping with her cycles. Periods are regular and monthly- moderate flow. She takes over the counter NSAIDs, which seems to help. She had a pelvic US done 01/2020 which was normal.    Pt has never been sexually active.     She also complains of small skin lesions on her vulva, +itching x3 days.      PMHx:  Past Medical History:   Diagnosis Date    Anti-NMDA receptor encephalitis 2014       PSHx:  Past Surgical History:   Procedure Laterality Date    TONSILLECTOMY         GYN Hx:   Patient's last menstrual period was 02/18/2020 (exact date).  Cycle Hx: menses regular and monthly, lasts 7 days (heavy for 3), +dysmenorrhea   Last Papsmear: 2019- normal   History of abnormal Pap Smear: denies  STD History: denies  Contraception: denies     OB Hx:   G0P0    Medication list:    Current Outpatient Medications:     azelastine (ASTELIN) 0.1 % nasal spray, 2 sprays by Nasal route 2 (two) times daily 2 sprays each nostril twice a day as needed, Disp: 30 mL, Rfl: 1    iron-vitamin C (Vitron-C) 65-125 MG Tab, Take 1 tablet by mouth 2 (two) times daily, Disp: 180 tablet, Rfl: 0    UNABLE TO FIND, fluticasone Metered Dose Nasal Spray [Flonase] 2 sprays in each nostril daily, Disp: , Rfl:     ALL:  Allergies   Allergen Reactions    Azithromycin Anaphylaxis    Phenytoin Anaphylaxis    Other Nausea And Vomiting and Headaches     Red Dye    Magnesium Rash    Red Dye Nausea And Vomiting and Other (See Comments)       Social Hx:  Social History     Socioeconomic History    Marital status: Single     Spouse name: Not on file    Number of children: Not on file    Years of education: Not on file    Highest education level: Not on file   Occupational History    Not on file   Tobacco Use    Smoking status: Never  Smoker    Smokeless tobacco: Never Used   Vaping Use    Vaping Use: Never used   Substance and Sexual Activity    Alcohol use: Yes     Comment: socially    Drug use: Never    Sexual activity: Not Currently     Partners: Male     Birth control/protection: None   Other Topics Concern    Not on file   Social History Narrative    Not on file     Review of Systems -  General ROS: negative for fatigue, fever/chills, weight loss  Breast ROS: negative for breast lumps, nipple discharge  Gastrointestinal ROS: no abdominal pain, change in bowel habits  Genitourinary ROS: no dysuria, trouble voiding, or hematuria  Skin: denies rash or lesion  Psych: denies depression  All other systems reviewed- negative      Objective:  BP 127/81    Pulse 78    Temp 98.1 F (36.7 C)    Ht 5\' 2"  (1.575 m)    Wt 136 lb 3.2  oz (61.8 kg)    LMP 02/18/2020 (Exact Date)    BMI 24.91 kg/m     General appearance - alert, well appearing, and in no distress  Abdomen - soft, nontender, nondistended, no masses  Extremities: no signs of clubbing or edema     Pelvic exam:  VULVA: normal appearing vulva with no masses, normal clitoris. Fungal skin changes on her R mons.     Assessment and Plan:  1. Dysmenorrhea   - management options reviewed including expectant, hormonals (IUD, nexplanon, pills, patch, ring), NSAIDs  - She opts for OCPs. Patient denies any contraindications to combined hormonal OCPs including migraine headaches with aura, HTN, personal or family history of DVT or Stroke.      2. Fungal infection of skin  - lotrimin recommended        - Follow up in 3 months    - All questions answered AVS reviewed with the patient.    No orders of the defined types were placed in this encounter.      Jeanie Cooks MD,

## 2020-02-25 NOTE — Patient Instructions (Signed)
Birth Control: The Pill    Birth control pills contain hormones that help prevent pregnancy. The pills are prescribed by your healthcare provider. There are many types of birth control pills available. If you have side effects from one type of pill, tell your healthcare provider. He or she may be able to prescribe a pill that works better for you.  Pregnancy rates  Talk to your healthcare provider about the effectiveness of this birth control method.  Using the pill   Take one pill daily. Take it at around the same time each day.   Follow your healthcare provider's guidelines on when to start your first pack of pills. You may need to use another form of birth control for a week or more after you start.   Know what to do if you forget to take a pill. (Consult your healthcare provider or check the package.) If you miss more than one pill, you may need to use a backup method of birth control for a week or more.    Pros   Low pregnancy rate   No interruption to sex   Easy to use   Can help make periods more regular   May lower your risk of ovarian cysts and certain cancers   May decrease menstrual cramps, menstrual flow, and acne    Cons   Does not protect against sexually transmittedinfections (STIs)   Requires taking a pill on time each day   May not work as well when taken with certain other medicines (check with your pharmacist)   May cause side effects such as nausea, irregular bleeding, headaches, breast tenderness, fatigue, or mood changes (these often go away within 3 months)   May increase the risk of blood clots,heart attack, and stroke    The pill may not be for you  The pill may not be for you if:   You are a smoker and over age 35   You havehigh blood pressureor gallbladder, liver, cerebrovascular or heart disease   You have diabetes, migraines, blood clot in the vein or artery, lupus, depression, certain lipid disorders, or take medicines that interfere with the pill  In these cases,  discuss the risks with your healthcare provider.  [employer] last reviewed this educational content on 10/17/2018   2000-2021 [employer]. All rights reserved. This information is not intended as a substitute for professional medical care. Always follow your healthcare professional's instructions.

## 2020-05-20 ENCOUNTER — Telehealth (HOSPITAL_BASED_OUTPATIENT_CLINIC_OR_DEPARTMENT_OTHER): Payer: Self-pay | Admitting: Obstetrics & Gynecology

## 2020-05-20 NOTE — Telephone Encounter (Signed)
Patient calling in to get [facility] refill, would like nurse to give CB , she stated she will be out  By Saturday

## 2020-05-21 ENCOUNTER — Other Ambulatory Visit (HOSPITAL_BASED_OUTPATIENT_CLINIC_OR_DEPARTMENT_OTHER): Payer: Self-pay

## 2020-05-21 MED ORDER — NORETHIN ACE-ETH ESTRAD-FE 1-20 MG-MCG(24) PO TABS
1.0000 | ORAL_TABLET | Freq: Every day | ORAL | 0 refills | Status: DC
Start: 2020-05-21 — End: 2020-06-12

## 2020-05-21 NOTE — Telephone Encounter (Signed)
Spoke with pt aware

## 2020-05-21 NOTE — Telephone Encounter (Signed)
Patient Contraception Refill Request Note    24 y.o. female patient requests refill for: Sarah Ball 1/20  Last seen in our office: Aug 2021  Last blood pressure: 127/81  Annual GYN exam: none  Scheduled 06/20/20    Request sent to MD/NP for courtesy refill until next OV    Ninfa Linden, MA

## 2020-06-10 ENCOUNTER — Other Ambulatory Visit (HOSPITAL_BASED_OUTPATIENT_CLINIC_OR_DEPARTMENT_OTHER): Payer: Self-pay | Admitting: Obstetrics & Gynecology

## 2020-06-15 ENCOUNTER — Telehealth (HOSPITAL_BASED_OUTPATIENT_CLINIC_OR_DEPARTMENT_OTHER): Payer: Self-pay | Admitting: Obstetrics & Gynecology

## 2020-06-15 ENCOUNTER — Encounter (HOSPITAL_BASED_OUTPATIENT_CLINIC_OR_DEPARTMENT_OTHER): Payer: Self-pay | Admitting: Obstetrics & Gynecology

## 2020-06-15 ENCOUNTER — Other Ambulatory Visit (HOSPITAL_BASED_OUTPATIENT_CLINIC_OR_DEPARTMENT_OTHER): Payer: Self-pay

## 2020-06-15 ENCOUNTER — Ambulatory Visit (INDEPENDENT_AMBULATORY_CARE_PROVIDER_SITE_OTHER): Payer: 59 | Admitting: Internal Medicine

## 2020-06-15 NOTE — Telephone Encounter (Signed)
Courtesy refill for OCP request sent to provider for review.

## 2020-06-15 NOTE — Telephone Encounter (Signed)
Pt has Videp appt 12/9  States she will be out of [facility] on 12/4   . No appts available for dr. Awilda Metro before 12/9

## 2020-06-15 NOTE — Telephone Encounter (Signed)
Patient requesting courtesy refill for OCP. Scheduled for visit on 06/25/2020, last pill is 06/20/2020. Will route to provider for review.

## 2020-06-16 MED ORDER — BLISOVI 24 FE 1-20 MG-MCG(24) PO TABS
1.0000 | ORAL_TABLET | Freq: Every day | ORAL | 0 refills | Status: DC
Start: 2020-06-16 — End: 2020-06-22

## 2020-06-22 ENCOUNTER — Telehealth (INDEPENDENT_AMBULATORY_CARE_PROVIDER_SITE_OTHER): Payer: 59 | Admitting: Obstetrics & Gynecology

## 2020-06-22 DIAGNOSIS — L292 Pruritus vulvae: Secondary | ICD-10-CM

## 2020-06-22 DIAGNOSIS — N946 Dysmenorrhea, unspecified: Secondary | ICD-10-CM

## 2020-06-22 MED ORDER — BLISOVI 24 FE 1-20 MG-MCG(24) PO TABS
1.0000 | ORAL_TABLET | Freq: Every day | ORAL | 3 refills | Status: DC
Start: 2020-06-22 — End: 2020-12-31

## 2020-06-22 NOTE — Progress Notes (Signed)
VIDEO VISIT  Verbal consent has been obtained from the patient to conduct a video and telephone visit to minimize exposure to COVID-19: yes    CC: follow up for dysmenorrhea     HPI:   Sarah Ball is a 24 y.o. G0P0000, No LMP recorded. Here today for follow up. She was last seen in August for dysmenorrhea and was started on OCPs. She presents today for 3 month follow up. She reports significant improvement in her symptoms, denies any side effects with her OCPS.  She continues to have vulvar itching that comes and goes. Symptoms started in August.     The following portions of the patient's chart were reviewed in this encounter and updated as appropriate: Tobacco   Allergies   Meds   Problems   Med Hx   Surg Hx   Fam Hx         Review of Systems -  General ROS: negative for fatigue, fever/chills, weight loss  Breast ROS: negative for breast lumps, nipple discharge  Gastrointestinal ROS: no abdominal pain, change in bowel habits  Genitourinary ROS: no dysuria, trouble voiding, or hematuria  Skin: denies rash or lesion  Psych: denies depression  All other systems reviewed- negative    Objective:  There were no vitals taken for this visit.    General appearance - alert, well appearing, and in no distress  Consultation only     Assessment and Plan:  1. Dysmenorrhea   - Improved with OCPs. Requests refill     2. Vulvar itching   - Episodic since August  - Patient encouraged to keep a diary of symptoms/diet/products used  - If symptoms persist, I recommend biopsy/cultures         - All questions answered AVS reviewed with the patient.  No orders of the defined types were placed in this encounter.      Jeanie Cooks, MD MD

## 2020-06-23 ENCOUNTER — Encounter (HOSPITAL_BASED_OUTPATIENT_CLINIC_OR_DEPARTMENT_OTHER): Payer: Self-pay | Admitting: Obstetrics & Gynecology

## 2020-06-25 ENCOUNTER — Telehealth (HOSPITAL_BASED_OUTPATIENT_CLINIC_OR_DEPARTMENT_OTHER): Payer: 59 | Admitting: Obstetrics & Gynecology

## 2020-08-04 ENCOUNTER — Encounter (INDEPENDENT_AMBULATORY_CARE_PROVIDER_SITE_OTHER): Payer: Self-pay | Admitting: Internal Medicine

## 2020-08-04 ENCOUNTER — Ambulatory Visit (INDEPENDENT_AMBULATORY_CARE_PROVIDER_SITE_OTHER): Payer: 59 | Admitting: Internal Medicine

## 2020-08-04 VITALS — BP 123/76 | HR 79 | Temp 98.8°F | Resp 18 | Ht 62.0 in | Wt 133.6 lb

## 2020-08-04 DIAGNOSIS — H6692 Otitis media, unspecified, left ear: Secondary | ICD-10-CM

## 2020-08-04 MED ORDER — AMOXICILLIN-POT CLAVULANATE 875-125 MG PO TABS
1.0000 | ORAL_TABLET | Freq: Two times a day (BID) | ORAL | 0 refills | Status: AC
Start: 2020-08-04 — End: 2020-08-14

## 2020-08-04 NOTE — Patient Instructions (Signed)
Middle Ear Infection (Otitis Media) in Adults  What is a middle ear infection?  A middle ear infection occurs behind the eardrum. It is most often caused by a virus or bacteria. Most kids have at least one middle ear infection by the time they are 26 years old. But adults can also get them.   What causes middle ear infections?  Inflammation in the middle ear most often starts after you’ve had a sore throat, cold, or other upper respiratory problem. The infection spreads to the middle ear and causes fluid buildup behind the eardrum.    What are the symptoms of a middle ear infection?  These are the most common symptoms of middle ear infections in adults:   · Ear pain  · Feeling of fullness in the ear  · Fluid draining from the ear  · Fever  · Hearing loss  These symptoms may look like other conditions or health problems. Always talk with your healthcare provider for a diagnosis.   How is a middle ear infection diagnosed?  Your healthcare provider will review your health history and do a physical exam. They will check the outer ear and the eardrum using an otoscope. This is a lighted tool that lets the healthcare provider see inside the ear. A pneumatic otoscope blows a puff of air into the ear to test eardrum movement. When there is fluid or infection in the middle ear, movement is decreased.   Your provider may also do a tympanometry. This is a test that directs air and sound to the middle ear.   If you have ear infections often, your healthcare provider may suggest having a hearing test.   How is a middle ear infection treated?  Treatment will depend on your symptoms, age, and general health. It will also depend on how severe the condition is.   Treatment may include:  · Antibiotics  · Pain relievers  · Placing small tubes in the eardrum for chronic ear infections   What are possible complications of a middle ear infection?   Untreated ear infections can lead to:  · Infection in other parts of the head  · Lasting  (permanent) hearing loss  · Speech and language problems  Can middle ear infections be prevented?  Cold and allergy medicines don't seem to prevent ear infections. And currently there is no vaccine that can prevent the disease. But check with your healthcare provider and make sure your vaccines are up-to-date. Living in a home where cigarettes are smoked can increase the chances of ear infections. So can living in a home where vaping devices, such as e-cigarettes and electronic nicotine, are used   Key points about middle ear infections  · Middle ear infections can affect both children and adults.  · Pain and fever can be the most common symptoms.  · Without treatment, permanent hearing loss may happen.  · Take antibiotics as prescribed and finish all of the prescription. This can help prevent antibiotic-resistant infections or incomplete treatment with the infection returning.  · Keeping your home smoke-free or free of vaping devices can decrease the chances of ear infections.    Next steps  Tips to help you get the most from a visit to your healthcare provider:  · Know the reason for your visit and what you want to happen.  · Before your visit, write down questions you want answered.  · Bring someone with you to help you ask questions and remember what your provider tells you.  · At the visit, write down   the name of a new diagnosis, and any new medicines, treatments, or tests. Also write down any new instructions your provider gives you.  · Know why a new medicine or treatment is prescribed, and how it will help you. Also know what the side effects are.  · Ask if your condition can be treated in other ways.  · Know why a test or procedure is recommended and what the results could mean.  · Know what to expect if you do not take the medicine or have the test or procedure.  · If you have a follow-up appointment, write down the date, time, and purpose for that visit.  · Know how you can contact your provider if you  have questions.  [employer] last reviewed this educational content on 07/19/2019  © 2000-2021 [employer]. All rights reserved. This information is not intended as a substitute for professional medical care. Always follow your healthcare professional's instructions.

## 2020-08-04 NOTE — Progress Notes (Signed)
Subjective:      Date: 08/04/2020 7:43 AM   Patient ID: Sarah Ball is a 25 y.o. female.    Chief Complaint:  Chief Complaint   Patient presents with    left ear pain     - started yesterday       HPI:  HPI   Patient is a 25 year old female presenting with complaints of left ear pain since yesterday.  She states pt was treated for left ear infection on 12/9 and 12/22 with amox and bactrim respectively  Did well till yesterday began having left ear pain again, states it feels very much like it did the previous times when she was diagnosed with ear infection.  She denies URI symptoms, denies fevers.  She is a non-smoker and states she has not been around secondhand smoke  Also not been swimming    Problem List:  Patient Active Problem List   Diagnosis    Seasonal allergies    History of palpitations       Current Medications:  No outpatient medications have been marked as taking for the 08/04/20 encounter (Office Visit) with Kela Millin, MD.       Allergies:  Allergies   Allergen Reactions    Azithromycin Anaphylaxis    Phenytoin Anaphylaxis    Other Nausea And Vomiting and Headaches     Red Dye    Magnesium Rash    Red Dye Nausea And Vomiting and Other (See Comments)       Past Medical History:  Past Medical History:   Diagnosis Date    Anti-NMDA receptor encephalitis 2014       Past Surgical History:  Past Surgical History:   Procedure Laterality Date    TONSILLECTOMY         Family History:  Family History   Problem Relation Age of Onset    Hypertension Mother     Cancer Maternal Grandmother         breast ca    Hypertension Maternal Grandmother     Breast cancer Maternal Grandmother 68    Cancer Maternal Grandfather         prostate    Hypertension Maternal Grandfather     No known problems Father     Colon cancer Neg Hx     Diabetes Neg Hx     Eclampsia Neg Hx     Miscarriages / Stillbirths Neg Hx     Ovarian cancer Neg Hx     Preterm labor Neg Hx     Stroke Neg Hx        Social  History:  Social History     Socioeconomic History    Marital status: Single   Tobacco Use    Smoking status: Never Smoker    Smokeless tobacco: Never Used   Haematologist Use: Never used   Substance and Sexual Activity    Alcohol use: Yes     Comment: socially    Drug use: Never    Sexual activity: Not Currently     Partners: Male     Birth control/protection: None        The following sections were reviewed this encounter by the provider:   Tobacco   Allergies   Meds   Problems   Med Hx   Surg Hx   Fam Hx          ROS:  General/Constitutional:   Denies Chills. Denies Fatigue. Denies Fever.  Ophthalmologic:   Denies Blurred vision. Denies Eye Pain.   ENT:    As above. Denies Ear pain. Denies Sinus pain.   Endocrine:   Denies Polydipsia. Denies Polyuria.   Respiratory:   Denies Cough.  Denies Shortness of breath. Denies Wheezing.   Cardiovascular:   Denies Chest pain.  Denies Swelling in hands/feet.   Gastrointestinal:   Denies Abdominal pain. . Denies Nausea. Denies Vomiting.   Genitourinary:   . Denies Frequent urination. Denies Painful urination.   Musculoskeletal:   Denies Leg cramps. Denies Muscle aches.   Skin:   Denies Skin lesion(s).   Neurologic:   Denies Dizziness. . Denies Headache.      Objective:   Vitals:  BP 123/76 (BP Site: Left arm, Cuff Size: Medium)    Pulse 79    Temp 98.8 F (37.1 C) (Oral)    Resp 18    Ht 1.575 m (5\' 2" )    Wt 60.6 kg (133 lb 9.6 oz)    LMP 07/21/2020 (Approximate)    BMI 24.44 kg/m       Physical Exam:  General Examination:   Physical Exam   GENERAL APPEARANCE: alert, in no acute distress, well developed, well nourished  oriented to time, place, and person.   H EENT: PERRL, EOMI, EACs clear bilaterally.  Left TM mildly erythematous with exudates, bulging  ORAL CAVITY: normal oropharynx, normal lips, mucosa moist.   NECK/THYROID: neck supple, no neck mass palpated, no thyromegaly.  LYMPH: no cervical/supraclavicular adenopathy   HEART: S1, S2 normal, no  murmurs, rubs, gallops, regular rate and rhythm.   LUNGS: normal effort / no distress, normal breath sounds, clear to auscultation  bilaterally, no wheezes, rales, rhonchi.   ABDOMEN:soft, +BS, NT/ND, no masses palpable  EXTREMITIES: No LE edema bilaterally.    PSYCH: alert and oriented to time,place and person.   SKIN: moist, no focal rash    Assessment:       1. Recurrent otitis media, left  - amoxicillin-clavulanate (Augmentin) 875-125 MG per tablet; Take 1 tablet by mouth 2 (two) times daily for 10 days  Dispense: 20 tablet; Refill: 0  - ENT Referral: Angela Adam, MD ([facility])        Plan:   As above      Kela Millin, MD

## 2020-08-04 NOTE — Progress Notes (Signed)
Have you seen any specialists/other providers since your last visit with us?    Yes    Arm preference verified?   Yes    The patient is due for     PAP SMEAR  HPV Series (1 - 2-dose series)  INFLUENZA VACCINE

## 2020-09-07 ENCOUNTER — Encounter (INDEPENDENT_AMBULATORY_CARE_PROVIDER_SITE_OTHER): Payer: Self-pay | Admitting: Internal Medicine

## 2020-09-07 ENCOUNTER — Ambulatory Visit (INDEPENDENT_AMBULATORY_CARE_PROVIDER_SITE_OTHER): Payer: 59 | Admitting: Internal Medicine

## 2020-09-07 VITALS — BP 114/72 | HR 90 | Temp 98.8°F | Resp 18 | Ht 62.0 in | Wt 133.4 lb

## 2020-09-07 DIAGNOSIS — L270 Generalized skin eruption due to drugs and medicaments taken internally: Secondary | ICD-10-CM

## 2020-09-07 MED ORDER — TRIAMCINOLONE ACETONIDE 0.1 % EX CREA
TOPICAL_CREAM | Freq: Two times a day (BID) | CUTANEOUS | 1 refills | Status: DC
Start: 2020-09-07 — End: 2021-01-12

## 2020-09-07 MED ORDER — CETIRIZINE HCL 10 MG PO TABS
10.0000 mg | ORAL_TABLET | Freq: Every day | ORAL | 1 refills | Status: DC
Start: 2020-09-07 — End: 2022-01-21

## 2020-09-07 NOTE — Progress Notes (Signed)
Have you seen any specialists/other providers since your last visit with us?    Yes    Arm preference verified?   Yes    The patient is due for     PAP SMEAR  HPV Series (1 - 2-dose series)  INFLUENZA VACCINE

## 2020-09-07 NOTE — Patient Instructions (Signed)
First Aid: Allergic Reactions  Limited reaction  A limited (localized) reaction affects only the area of contact. Some reactions may not show up for days. Others can occur almost right away.  Step 1. Stop the source   If the person has been stung, scrape the stinger away with the edge of a credit card or the dull edge of a knife. Don't use fingers or tweezers to remove a stinger. If pinched, the stinger may empty its venom into the skin.   If the reaction is caused by eating a specific food or taking a medicine, the person should not eat or take the substance again.  Step 2. Treat skin irritation   Wash insect bites with soap and water.   Remove and wash in hot water all clothing that may have plant oils or any other substance that has caused a reaction on them. Shower with plenty of soap to washany plant oils or other allergens off the skin.   Ask your healthcare provider how to control itchy or irritated skin.    Severe reaction  Anaphylaxis is a severe life-threatening allergic reaction that needs medical attention right away. In extreme cases, the airways from mouth to lungs may swell and cause difficulty breathing. The reaction may happen right away or over several hours. Call 911 right away for medical help.  Step 1. Calm the person   Help the person into a comfortable position. Prop up his or her head to help with breathing.   Tell the person to remain still and limit talking.   If the person carries medicine (epinephrine) to control anaphylaxis, help him or her use it.   Prevent any further contact with or exposure to allergen.  Step 2. Monitor breathing   Watch for signs of airway swelling such as wheezing or swollen lips. With an extreme reaction, the person may have trouble getting any breath.   Do rescue breathing, if needed. In extreme cases, you may not be able to get air into the lungs.    Call 911  Call 911 right away if the person has any of the following. Or if they have a  combination of mild or severe symptoms listed below.   Trouble breathing, shortness of breath, wheezing, or continued cough   A history of airway swelling (anaphylaxis)   Continued vomiting   Severe diarrhea   Lips or nail beds look pale, blue, purple or gray   Feeling faint, dizzy or confused   Weak pulse   Throat feels tight or hoarse   Trouble swallowing or talking   Swelling of the tongue or lips   Feeling of doom or feeling something bad is about to happen   Hives all over the body or redness.  [employer] last reviewed this educational content on 07/18/2018   2000-2021 [employer]. All rights reserved. This information is not intended as a substitute for professional medical care. Always follow your healthcare professional's instructions.

## 2020-09-07 NOTE — Progress Notes (Signed)
Subjective:      Date: 09/07/2020 12:24 PM   Patient ID: Sarah Ball is a 25 y.o. female.    Chief Complaint:  Chief Complaint   Patient presents with    Rash/little bumps/itchy     started 2.16.22 on groin and stomach then spread to upper thighs & rt arm       HPI:  HPI   Patient is a 25 year old female presenting with complaints of a rash on her lower abdomen, upper arm and groin x5days.  She describes the rash as red and itchy when it started out.  States onset of rash was day after she started taking Azo for the first time.  Reports she started taking benadryl and calamine lotion which has helped with the itching and the rash is not as red not spreading anymore.  Denies using any new skin care products also no new detergents.  With regards to new medications she reports she recently completed steroids prescribed per ENT for ear and jaw pain, and other than that Took azo Monday Tuesday and rash started on Wed. ti was 1st time taking azo as mentioned above  Denies fever, also denies any urinary symptoms at this time.  Problem List:  Patient Active Problem List   Diagnosis    Seasonal allergies    History of palpitations       Current Medications:  Outpatient Medications Marked as Taking for the 09/07/20 encounter (Office Visit) with Kela Millin, MD   Medication Sig Dispense Refill    azelastine (ASTELIN) 0.1 % nasal spray 2 sprays by Nasal route 2 (two) times daily 2 sprays each nostril twice a day as needed 30 mL 1    iron-vitamin C (Vitron-C) 65-125 MG Tab Take 1 tablet by mouth 2 (two) times daily 180 tablet 0    norethin ace-eth estrad-FE (Blisovi 24 Fe) 1-20 MG-MCG(24) per tablet Take 1 tablet by mouth daily 84 tablet 3    UNABLE TO FIND fluticasone Metered Dose Nasal Spray [Flonase] 2 sprays in each nostril daily         Allergies:  Allergies   Allergen Reactions    Azithromycin Anaphylaxis    Phenytoin Anaphylaxis    Azo [Phenazopyridine]      rash    Other Nausea And Vomiting and  Headaches     Red Dye    Magnesium Rash    Red Dye Nausea And Vomiting and Other (See Comments)       Past Medical History:  Past Medical History:   Diagnosis Date    Anti-NMDA receptor encephalitis 2014       Past Surgical History:  Past Surgical History:   Procedure Laterality Date    TONSILLECTOMY         Family History:  Family History   Problem Relation Age of Onset    Hypertension Mother     Cancer Maternal Grandmother         breast ca    Hypertension Maternal Grandmother     Breast cancer Maternal Grandmother 78    Cancer Maternal Grandfather         prostate    Hypertension Maternal Grandfather     No known problems Father     Colon cancer Neg Hx     Diabetes Neg Hx     Eclampsia Neg Hx     Miscarriages / Stillbirths Neg Hx     Ovarian cancer Neg Hx     Preterm labor Neg Hx  Stroke Neg Hx        Social History:  Social History     Socioeconomic History    Marital status: Single   Tobacco Use    Smoking status: Never Smoker    Smokeless tobacco: Never Used   Haematologist Use: Never used   Substance and Sexual Activity    Alcohol use: Yes     Comment: socially    Drug use: Never    Sexual activity: Not Currently     Partners: Male     Birth control/protection: None        The following sections were reviewed this encounter by the provider:   Tobacco   Allergies   Meds   Problems   Med Hx   Surg Hx   Fam Hx          ROS:  General/Constitutional:   Denies Chills. Denies Fatigue. Denies Fever.   Ophthalmologic:   Denies Blurred vision. Denies Eye Pain.   ENT:   Denies Nasal Discharge. Denies Ear pain. Denies Sinus pain.   Endocrine:   Denies Polydipsia. Denies Polyuria.   Respiratory:   Denies Cough. Denies Orthopnea. Denies Shortness of breath. Denies Wheezing.   Cardiovascular:   Denies Chest pain. Denies Chest pain with exertion. Denies Leg Claudication. Denies Palpitations. Denies Swelling in hands/feet.   Gastrointestinal:   Denies Abdominal pain. Denies Blood in stool.  Denies Constipation. Denies Diarrhea. Denies Heartburn. Denies Nausea. Denies Vomiting.   Genitourinary:   Denies Blood in urine. Denies Frequent urination. Denies Painful urination.   Musculoskeletal:   Denies Leg cramps. Denies Muscle aches.   Skin:   Denies Skin lesion(s).   Neurologic:   Denies Dizziness. Denies Gait abnormality. Denies Headache. Denies Tingling/Numbness.     Objective:   Vitals:  BP 114/72 (BP Site: Right arm, Cuff Size: Medium)    Pulse 90    Temp 98.8 F (37.1 C) (Oral)    Resp 18    Ht 1.575 m (5\' 2" )    Wt 60.5 kg (133 lb 6.4 oz)    LMP 08/18/2020 (Approximate)    BMI 24.40 kg/m       Physical Exam:  General Examination:   Physical Exam   GENERAL APPEARANCE: alert, in no acute distress, well developed, well nourished  oriented to time, place, and person.   ORAL CAVITY: normal oropharynx, normal lips, mucosa moist.   NECK/THYROID: neck supple, no neck mass palpated, no thyromegaly.  LYMPH: no cervical/supraclavicular adenopathy   HEART: S1, S2 normal, no murmurs, rubs, gallops, regular rate and rhythm.   LUNGS: normal effort / no distress, normal breath sounds, clear to auscultation  bilaterally, no wheezes, rales, rhonchi.   ABDOMEN:soft, +BS, NT/ND, no masses palpable  EXTREMITIES: No LE edema bilaterally.   NEURO:CN2-12 grossly intact, nonfocal  PSYCH: alert and oriented to time,place and person.   SKIN: Resolving rash>> dark discoloration with scabbed excoriations on lower abdomen, upper thigh, groins bilaterally and right upper arm    Assessment:       1. Allergic drug rash( azo)  - triamcinolone (KENALOG) 0.1 % cream; Apply topically 2 (two) times daily Apply to affected area 2 times daily  Dispense: 15 g; Refill: 1  - cetirizine (ZyrTEC Allergy) 10 MG tablet; Take 1 tablet (10 mg total) by mouth daily  Dispense: 30 tablet; Refill: 1   -Avoid/no further Azo   -Follow-up as needed if any worsening      Plan:  As above  Return if symptoms worsen or fail to improve.        Kela Millin, MD

## 2020-12-17 ENCOUNTER — Other Ambulatory Visit: Payer: Self-pay | Admitting: Orthopaedic Surgery

## 2020-12-17 DIAGNOSIS — M25551 Pain in right hip: Secondary | ICD-10-CM

## 2020-12-30 ENCOUNTER — Encounter (HOSPITAL_BASED_OUTPATIENT_CLINIC_OR_DEPARTMENT_OTHER): Payer: Self-pay | Admitting: Obstetrics & Gynecology

## 2020-12-31 ENCOUNTER — Other Ambulatory Visit (HOSPITAL_BASED_OUTPATIENT_CLINIC_OR_DEPARTMENT_OTHER): Payer: Self-pay

## 2020-12-31 MED ORDER — NORETHIN ACE-ETH ESTRAD-FE 1-20 MG-MCG(24) PO TABS
1.0000 | ORAL_TABLET | Freq: Every day | ORAL | 1 refills | Status: DC
Start: 2020-12-31 — End: 2020-12-31

## 2020-12-31 MED ORDER — NORETHIN ACE-ETH ESTRAD-FE 1-20 MG-MCG PO TABS
1.0000 | ORAL_TABLET | Freq: Every day | ORAL | 0 refills | Status: DC
Start: 2020-12-31 — End: 2021-01-12

## 2020-12-31 NOTE — Telephone Encounter (Signed)
Pt schedule for an ann and needs bc change to lo loestrin fe due to insurance not covering her previous bc ann schedule on 01/12/21

## 2021-01-12 ENCOUNTER — Ambulatory Visit: Payer: Self-pay

## 2021-01-12 ENCOUNTER — Ambulatory Visit (INDEPENDENT_AMBULATORY_CARE_PROVIDER_SITE_OTHER): Payer: BC Managed Care – PPO | Admitting: Nurse Practitioner

## 2021-01-12 ENCOUNTER — Encounter (HOSPITAL_BASED_OUTPATIENT_CLINIC_OR_DEPARTMENT_OTHER): Payer: Self-pay | Admitting: Nurse Practitioner

## 2021-01-12 VITALS — BP 125/85 | HR 92 | Temp 98.1°F | Resp 16 | Ht 62.0 in | Wt 133.2 lb

## 2021-01-12 DIAGNOSIS — B379 Candidiasis, unspecified: Secondary | ICD-10-CM

## 2021-01-12 DIAGNOSIS — Z01411 Encounter for gynecological examination (general) (routine) with abnormal findings: Secondary | ICD-10-CM

## 2021-01-12 DIAGNOSIS — Z3041 Encounter for surveillance of contraceptive pills: Secondary | ICD-10-CM

## 2021-01-12 LAB — THIN PREP-PAP W/IMAGING, REFLEX HPV DNA(HIGH RISK W/16/18)

## 2021-01-12 MED ORDER — NYSTATIN 100000 UNIT/GM EX CREA
TOPICAL_CREAM | CUTANEOUS | 1 refills | Status: AC | PRN
Start: 2021-01-12 — End: ?

## 2021-01-12 MED ORDER — NORETHIN ACE-ETH ESTRAD-FE 1-20 MG-MCG PO TABS
1.0000 | ORAL_TABLET | Freq: Every day | ORAL | 3 refills | Status: DC
Start: 2021-01-12 — End: 2021-01-19

## 2021-01-12 NOTE — Addendum Note (Signed)
Addended by: Jacqlyn Larsen on: 01/12/2021 01:26 PM     Modules accepted: Orders

## 2021-01-12 NOTE — Progress Notes (Signed)
CC/HPI    Sarah Ball is a 25 y.o. female, established patient, here today for annual gyn exam.    C/o recurrent fungal infections in groin.  Also gets them under breasts and is currently using Lotrimen cream.      GYN Hx   Sexually Active: not currently  Birth Control: Loestrin Fe 1/20  Menses: 13/4-5/monthly    Last pap: ~2019, neg per pt  Hx abnormal paps: denies  Colposcopy:  N/A  Hx STDs:  denies    OB Hx  [id number]    Allergies  Allergies   Allergen Reactions    Azithromycin Anaphylaxis    Phenytoin Anaphylaxis    Azo [Phenazopyridine]      rash    Other Nausea And Vomiting and Headaches     Red Dye    Magnesium Rash    Red Dye Nausea And Vomiting and Other (See Comments)       Medications  Current Outpatient Medications   Medication Sig Dispense Refill    azelastine (ASTELIN) 0.1 % nasal spray 2 sprays by Nasal route 2 (two) times daily 2 sprays each nostril twice a day as needed 30 mL 1    Ferrous Sulfate (IRON PO) Take 125 mg by mouth daily Slow release      norethindrone-ethinyl estradiol-FE (Loestrin Fe 1/20) 1-20 MG-MCG per tablet Take 1 tablet by mouth daily 84 tablet 3    cetirizine (ZyrTEC Allergy) 10 MG tablet Take 1 tablet (10 mg total) by mouth daily 30 tablet 1    methylPREDNISolone (MEDROL DOSPACK) 4 MG tablet Take 8 mg by mouth 2 (two) times daily Took for 6 days for ear and jaw pain      nystatin (MYCOSTATIN) cream Apply topically as needed (vulvar itching) 30 g 1    UNABLE TO FIND fluticasone Metered Dose Nasal Spray [Flonase] 2 sprays in each nostril daily       No current facility-administered medications for this visit.       PMH  Past Medical History:   Diagnosis Date    Anti-NMDA receptor encephalitis 2014           PSH  Past Surgical History:   Procedure Laterality Date    TONSILLECTOMY         SH  Social History     Socioeconomic History    Marital status: Single   Tobacco Use    Smoking status: Never    Smokeless tobacco: Never   Vaping Use    Vaping Use: Never used   Substance and  Sexual Activity    Alcohol use: Yes     Comment: socially    Drug use: Never    Sexual activity: Not Currently     Birth control/protection: OCP     Social Determinants of Health     Financial Resource Strain: Low Risk     Difficulty of Paying Living Expenses: Not hard at all   Food Insecurity: No Food Insecurity    Worried About Programme researcher, broadcasting/film/video in the Last Year: Never true    Ran Out of Food in the Last Year: Never true   Transportation Needs: No Transportation Needs    Lack of Transportation (Medical): No    Lack of Transportation (Non-Medical): No   Physical Activity: Sufficiently Active    Days of Exercise per Week: 4 days    Minutes of Exercise per Session: 40 min   Stress: No Stress Concern Present    Feeling of Stress :  Only a little   Social Connections: Moderately Integrated    Frequency of Communication with Friends and Family: More than three times a week    Frequency of Social Gatherings with Friends and Family: Once a week    Attends Religious Services: More than 4 times per year    Active Member of [employer] or Organizations: Yes    Attends Engineer, structural: More than 4 times per year    Marital Status: Never married   Catering manager Violence: Not At Risk    Fear of Current or Ex-Partner: No    Emotionally Abused: No    Physically Abused: No    Sexually Abused: No   Housing Stability: Low Risk     Unable to Pay for Housing in the Last Year: No    Number of Places Lived in the Last Year: 2    Unstable Housing in the Last Year: No       FH  Family History   Problem Relation Age of Onset    Hypertension Mother     Cancer Maternal Grandmother         breast ca    Hypertension Maternal Grandmother     Breast cancer Maternal Grandmother 45    Cancer Maternal Grandfather         prostate    Hypertension Maternal Grandfather     No known problems Father     Colon cancer Neg Hx     Diabetes Neg Hx     Eclampsia Neg Hx     Miscarriages / Stillbirths Neg Hx     Ovarian cancer Neg Hx     Preterm  labor Neg Hx     Stroke Neg Hx      VITAL SIGNS  BP 125/85 (BP Site: Left arm, Patient Position: Sitting, Cuff Size: Medium)    Pulse 92    Temp 98.1 F (36.7 C)    Resp 16    Ht 5\' 2"  (1.575 m)    Wt 133 lb 3.2 oz (60.4 kg)    LMP 12/31/2020 (Exact Date)    BMI 24.36 kg/m     EXAM  GENERAL APPEARANCE    Alert, Well appearing, Oriented x 3, In no distress    BREAST  Sexual Maturity:  Tanner 5  Nipples:  Left - normal, no discharge; Right - normal, no discharge  Breast:  symmetrical; Left - normal, non-tender, no mass; Right - normal, non-tender, no mass    PELVIC  Vulva:  normal, no lesions, normal clitorus  Perineum:  intact  Introitus:  normal, no lesions, no discharge  Urethra:  normal  Vagina:  normal, no lesions, no masses, no tenderness  Cervix:  normal, no discharge, no lesions, non-friable  Uterus:  normal, not enlarged, no masses  Adnexa:  Left - normal, non-tender, not enlarged; Right - normal, non-tender, not enlarged  Rectal:  deferred    ASSESSMENT  1. Encounter for gynecological examination (general) (routine) with abnormal findings    2. Surveillance for birth control, oral contraceptives    3. Candida infection        PLAN  Orders Placed This Encounter   Medications    norethindrone-ethinyl estradiol-FE (Loestrin Fe 1/20) 1-20 MG-MCG per tablet     Sig: Take 1 tablet by mouth daily     Dispense:  84 tablet     Refill:  3    nystatin (MYCOSTATIN) cream     Sig: Apply topically as needed (vulvar  itching)     Dispense:  30 g     Refill:  1      Pt aware normal results will be sent via My Chart.  Pap today.  Will use Nystatin cream prn for groin fungal infections.    Kresta Templeman L. Haydan Wedig, MS, RN, WHNP-BC

## 2021-01-14 LAB — LAB USE ONLY - HISTORICAL GYN CYTOLOGY/PAP SMEAR

## 2021-01-19 ENCOUNTER — Telehealth (HOSPITAL_BASED_OUTPATIENT_CLINIC_OR_DEPARTMENT_OTHER): Payer: BC Managed Care – PPO | Admitting: Nurse Practitioner

## 2021-01-19 MED ORDER — JUNEL FE 24 1-20 MG-MCG(24) PO TABS
1.0000 | ORAL_TABLET | Freq: Every day | ORAL | 3 refills | Status: DC
Start: 2021-01-19 — End: 2021-12-30

## 2021-03-17 ENCOUNTER — Ambulatory Visit (INDEPENDENT_AMBULATORY_CARE_PROVIDER_SITE_OTHER): Payer: BC Managed Care – PPO | Admitting: Internal Medicine

## 2021-03-17 ENCOUNTER — Encounter (INDEPENDENT_AMBULATORY_CARE_PROVIDER_SITE_OTHER): Payer: Self-pay | Admitting: Internal Medicine

## 2021-03-17 VITALS — BP 123/71 | HR 87 | Temp 98.2°F | Resp 20 | Ht 62.0 in | Wt 136.8 lb

## 2021-03-17 DIAGNOSIS — Z0000 Encounter for general adult medical examination without abnormal findings: Secondary | ICD-10-CM

## 2021-03-17 LAB — CBC AND DIFFERENTIAL
Absolute NRBC: 0 10*3/uL (ref 0.00–0.00)
Basophils Absolute Automated: 0.03 10*3/uL (ref 0.00–0.08)
Basophils Automated: 0.6 %
Eosinophils Absolute Automated: 0.03 10*3/uL (ref 0.00–0.44)
Eosinophils Automated: 0.6 %
Hematocrit: 39.8 % (ref 34.7–43.7)
Hgb: 12.3 g/dL (ref 11.4–14.8)
Immature Granulocytes Absolute: 0.01 10*3/uL (ref 0.00–0.07)
Immature Granulocytes: 0.2 %
Lymphocytes Absolute Automated: 1.47 10*3/uL (ref 0.42–3.22)
Lymphocytes Automated: 29.8 %
MCH: 27 pg (ref 25.1–33.5)
MCHC: 30.9 g/dL — ABNORMAL LOW (ref 31.5–35.8)
MCV: 87.3 fL (ref 78.0–96.0)
MPV: 11.4 fL (ref 8.9–12.5)
Monocytes Absolute Automated: 0.25 10*3/uL (ref 0.21–0.85)
Monocytes: 5.1 %
Neutrophils Absolute: 3.14 10*3/uL (ref 1.10–6.33)
Neutrophils: 63.7 %
Nucleated RBC: 0 /100 WBC (ref 0.0–0.0)
Platelets: 341 10*3/uL (ref 142–346)
RBC: 4.56 10*6/uL (ref 3.90–5.10)
RDW: 14 % (ref 11–15)
WBC: 4.93 10*3/uL (ref 3.10–9.50)

## 2021-03-17 LAB — IRON PROFILE
Iron Saturation: 17 % (ref 15–50)
Iron: 56 ug/dL (ref 40–145)
TIBC: 330 ug/dL (ref 265–497)
UIBC: 274 ug/dL (ref 126–382)

## 2021-03-17 LAB — COMPREHENSIVE METABOLIC PANEL
ALT: 10 U/L (ref 0–55)
AST (SGOT): 14 U/L (ref 5–34)
Albumin/Globulin Ratio: 1.5 (ref 0.9–2.2)
Albumin: 4.1 g/dL (ref 3.5–5.0)
Alkaline Phosphatase: 40 U/L (ref 37–117)
Anion Gap: 7 (ref 5.0–15.0)
BUN: 10 mg/dL (ref 7.0–19.0)
Bilirubin, Total: 1 mg/dL (ref 0.2–1.2)
CO2: 25 mEq/L (ref 21–29)
Calcium: 9.3 mg/dL (ref 8.5–10.5)
Chloride: 107 mEq/L (ref 100–111)
Creatinine: 0.8 mg/dL (ref 0.4–1.5)
Globulin: 2.8 g/dL (ref 2.0–3.6)
Glucose: 79 mg/dL (ref 70–100)
Potassium: 4.6 mEq/L (ref 3.5–5.1)
Protein, Total: 6.9 g/dL (ref 6.0–8.3)
Sodium: 139 mEq/L (ref 136–145)

## 2021-03-17 LAB — LIPID PANEL
Cholesterol / HDL Ratio: 2.5 Index
Cholesterol: 157 mg/dL (ref 0–199)
HDL: 63 mg/dL (ref 40–9999)
LDL Calculated: 83 mg/dL (ref 0–99)
Triglycerides: 57 mg/dL (ref 34–149)
VLDL Calculated: 11 mg/dL (ref 10–40)

## 2021-03-17 LAB — HEMOGLOBIN A1C
Average Estimated Glucose: 105.4 mg/dL
Hemoglobin A1C: 5.3 % (ref 4.6–5.9)

## 2021-03-17 LAB — VITAMIN D,25 OH,TOTAL: Vitamin D, 25 OH, Total: 49 ng/mL (ref 30–100)

## 2021-03-17 LAB — HEMOLYSIS INDEX: Hemolysis Index: 4 Index (ref 0–24)

## 2021-03-17 LAB — GFR: EGFR: >60

## 2021-03-17 NOTE — Progress Notes (Signed)
Have you seen any specialists/other providers since your last visit with [place]?    No        Arm preference verified?   Yes, no preference    Health Maintenance Due   Topic Date Due    Tetanus Ten-Year  Never done    HPV Series (1 - 2-dose series) Never done    COVID-19 Vaccine (2 - Booster for Janssen series) 09/29/2020    INFLUENZA VACCINE  Never done

## 2021-03-17 NOTE — Progress Notes (Signed)
Subjective:      Date: 03/17/2021 9:00 AM   Patient ID: Sarah Ball is a 25 y.o. female.    Chief Complaint:  Chief Complaint   Patient presents with    Annual Exam     fasting       HPI  Visit Type: Health Maintenance Visit  Work Status: working full-time  Reported Health: good health   Reported Diet:  vegetarian x5-20yrs  Reported Exercise:  4x/week  Dental: regular dental visits twice a year  Vision: glasses, contact lenses and regular eye exams   Hearing: normal hearing  Immunization Status: immunizations up to date  Reproductive Health: not currently sexually active and premenopausal  Prior Screening Tests: last pap smear in 2022  General Health Risks: family history of prostate cancer, no family history of colon cancer and family history of breast cancer  Safety Elements Used: uses seat belts and smoke detectors in household  Requesting vit d and iron levels.  Problem List:  Patient Active Problem List   Diagnosis    Seasonal allergies    History of palpitations       Current Medications:  Current Outpatient Medications   Medication Sig Dispense Refill    azelastine (ASTELIN) 0.1 % nasal spray 2 sprays by Nasal route 2 (two) times daily 2 sprays each nostril twice a day as needed 30 mL 1    cetirizine (ZyrTEC Allergy) 10 MG tablet Take 1 tablet (10 mg total) by mouth daily (Patient taking differently: Take 10 mg by mouth as needed) 30 tablet 1    Ferrous Sulfate (IRON PO) Take 125 mg by mouth daily Slow release      norethin ace-eth estrad-FE (Junel Fe 24) 1-20 MG-MCG(24) per tablet Take 1 tablet by mouth daily 84 tablet 3    nystatin (MYCOSTATIN) cream Apply topically as needed (vulvar itching) 30 g 1    UNABLE TO FIND daily fluticasone Metered Dose Nasal Spray [Flonase] 2 sprays in each nostril daily      VITAMIN D PO Take 2,000 IU by mouth every other day      Ferrous Sulfate Dried (Slow Release Iron) 45 MG Tab CR Take 45 mg by mouth daily (Patient not taking: Reported on 03/17/2021)       methylPREDNISolone (MEDROL DOSPACK) 4 MG tablet Take 8 mg by mouth 2 (two) times daily Took for 6 days for ear and jaw pain (Patient not taking: Reported on 03/17/2021)       No current facility-administered medications for this visit.       Allergies:  Allergies   Allergen Reactions    Azithromycin Anaphylaxis    Phenytoin Anaphylaxis    Azo [Phenazopyridine]      rash    Magnesium Rash    Other Nausea And Vomiting and Headaches     Red Dye    Red Dye Nausea And Vomiting and Other (See Comments)       Past Medical History:  Past Medical History:   Diagnosis Date    Anti-NMDA receptor encephalitis 2014       Past Surgical History:  Past Surgical History:   Procedure Laterality Date    TONSILLECTOMY         Family History:  Family History   Problem Relation Age of Onset    Hypertension Mother     Cancer Maternal Grandmother         breast ca    Hypertension Maternal Grandmother     Breast cancer Maternal Grandmother  70    Cancer Maternal Grandfather         prostate    Hypertension Maternal Grandfather     No known problems Father     Colon cancer Neg Hx     Diabetes Neg Hx     Eclampsia Neg Hx     Miscarriages / Stillbirths Neg Hx     Ovarian cancer Neg Hx     Preterm labor Neg Hx     Stroke Neg Hx        Social History:  Social History     Socioeconomic History    Marital status: Single   Tobacco Use    Smoking status: Never    Smokeless tobacco: Never   Vaping Use    Vaping Use: Never used   Substance and Sexual Activity    Alcohol use: Yes     Comment: socially    Drug use: Never    Sexual activity: Not Currently     Birth control/protection: OCP     Social Determinants of Health     Financial Resource Strain: Low Risk     Difficulty of Paying Living Expenses: Not hard at all   Food Insecurity: No Food Insecurity    Worried About Programme researcher, broadcasting/film/video in the Last Year: Never true    Ran Out of Food in the Last Year: Never true   Transportation Needs: No Transportation Needs    Lack of Transportation (Medical): No     Lack of Transportation (Non-Medical): No   Physical Activity: Sufficiently Active    Days of Exercise per Week: 4 days    Minutes of Exercise per Session: 50 min   Stress: No Stress Concern Present    Feeling of Stress : Only a little   Social Connections: Moderately Integrated    Frequency of Communication with Friends and Family: More than three times a week    Frequency of Social Gatherings with Friends and Family: Twice a week    Attends Religious Services: More than 4 times per year    Active Member of [employer] or Organizations: Yes    Attends Engineer, structural: More than 4 times per year    Marital Status: Never married   Catering manager Violence: Not At Risk    Fear of Current or Ex-Partner: No    Emotionally Abused: No    Physically Abused: No    Sexually Abused: No   Housing Stability: Low Risk     Unable to Pay for Housing in the Last Year: No    Number of Places Lived in the Last Year: 1    Unstable Housing in the Last Year: No        The following sections were reviewed this encounter by the provider:        ROS:   General/Constitutional:   Denies Change in appetite. Denies Chills. Denies Fatigue. Denies Fever.   Ophthalmologic:   Denies Blurred vision. Denies Eye Discharge. Denies Eye Pain.   ENT:   Denies Nasal Discharge. Denies Hoarseness. Denies Ear pain. Denies Nosebleed. Denies Sinus pain. Denies Sore throat.   Endocrine:   Denies Polydipsia. Denies Polyuria. Denies Weakness.   Respiratory:   Denies Paroxysmal Nocturnal Dyspnea. Denies Cough. Denies Orthopnea. Denies Shortness of breath. Denies Wheezing.   Cardiovascular:   Denies Chest pain. Denies Chest pain with exertion. Denies Leg Claudication.. Denies Swelling in hands/feet.   Gastrointestinal:   Denies Abdominal pain. Denies Blood in stool. Denies Constipation.  Denies Diarrhea. Denies Heartburn. Denies Nausea. Denies Vomiting.   Hematology:   Denies Easy bruising. Denies Easy Bleeding. Denies Swollen glands.   Genitourinary:    Denies Blood in urine. Denies Nocturia. Denies Painful urination.   Musculoskeletal:   Denies Joint pain. Denies Joint stiffness. Denies Leg cramps. Denies Muscle aches. Denies Weakness in LE. Denies Swollen joints. Denies Weakness in UE.   Skin:   Denies Itching.Denies Rash.   Neurologic:   Denies Balance difficulty. Denies Dizziness. Denies Gait abnormality. Denies Headache. Denies Pre-Syncope. Denies Memory loss. Denies Seizures. Denies Tingling/Numbness.   Psychiatric:   Denies Anxiety. Denies Depressed mood. Denies Difficulty sleeping.     Objective:     Vitals:  BP 123/71 (BP Site: Right arm, Patient Position: Sitting, Cuff Size: Medium)   Pulse 87   Temp 98.2 F (36.8 C) (Temporal)   Resp 20   Ht 1.575 m (5\' 2" )   Wt 62.1 kg (136 lb 12.8 oz)   LMP 02/28/2021 (Exact Date)   SpO2 99%   BMI 25.02 kg/m     Examination:   General Examination:   GENERAL APPEARANCE: alert, in no acute distress, well developed, well nourished, oriented to time, place, and person.   HEAD: normal appearance, atraumatic.   EYES: extraocular movement intact (EOMI), pupils equal, round, reactive to light and accommodation, sclera anicteric, conjunctiva clear.   EARS: tympanic membranes normal bilaterally, external canals normal .   NOSE: nares patent..   ORAL CAVITY: normal oropharynx, normal lips, mucosa moist, no lesions.   THROAT: normal appearance, clear, no erythema.   NECK/THYROID: neck supple, no carotid bruit, carotid pulse 2+ bilaterally, no cervical lymphadenopathy, no neck mass palpated, no jugular venous distention, no thyromegaly.   LYMPH NODES: no palpable adenopathy.   SKIN: good turgor, no rashes, no suspicious lesions.   HEART: S1, S2 normal, no murmurs, rubs, gallops, regular rate and rhythm.   LUNGS: normal effort / no distress, normal breath sounds, clear to auscultation bilaterally, no wheezes, rales, rhonchi.   ABDOMEN: bowel sounds present, no hepatosplenomegaly, soft, nontender, nondistended.   FEMALE  GENITOURINARY: Deferred, up-to-date  MUSCULOSKELETAL: full range of motion, no swelling or deformity.   EXTREMITIES: no edema, no clubbing, cyanosis, or edema.   PERIPHERAL PULSES: 2+ dorsalis pedis, 2+ posterior tibial.   NEUROLOGIC: nonfocal, cranial nerves 2-12 grossly intact, deep tendon reflexes 2+ symmetrical, normal strength, tone and reflexes, sensory exam intact.   PSYCH: cognitive function intact, mood/affect full range, speech clear.     Assessment/Plan:       1. Well adult exam  - CBC and differential  - Comprehensive metabolic panel  - TSH  - Lipid panel  - Hemoglobin A1C  - IRON PROFILE  - Vitamin B12  - Vitamin D,25 OH, Total      Health Maintenance:  Immunizations UTD. Vision screening UTD. Dental Screening UTD. Cervical cancer screening is UTD.    Return in about 1 year (around 03/17/2022) for Annual Physical.    Kela Millin, MD

## 2021-03-18 LAB — TSH: TSH: 0.81 u[IU]/mL (ref 0.35–4.94)

## 2021-03-18 LAB — VITAMIN B12: Vitamin B-12: 425 pg/mL (ref 211–911)

## 2021-04-01 ENCOUNTER — Telehealth (INDEPENDENT_AMBULATORY_CARE_PROVIDER_SITE_OTHER): Payer: BC Managed Care – PPO | Admitting: Nurse Practitioner

## 2021-04-01 ENCOUNTER — Encounter (INDEPENDENT_AMBULATORY_CARE_PROVIDER_SITE_OTHER): Payer: Self-pay | Admitting: Nurse Practitioner

## 2021-04-01 DIAGNOSIS — R21 Rash and other nonspecific skin eruption: Secondary | ICD-10-CM

## 2021-04-01 MED ORDER — CLOBETASOL PROPIONATE 0.05 % EX OINT
TOPICAL_OINTMENT | Freq: Two times a day (BID) | CUTANEOUS | 0 refills | Status: AC
Start: 2021-04-01 — End: ?

## 2021-04-01 NOTE — Progress Notes (Signed)
Sarah Ball    PROGRESS NOTE      Patient: Sarah Ball   Date: 04/01/2021   MRN: [id number]     Past Medical History:   Diagnosis Date    Anti-NMDA receptor encephalitis 2014     Social History     Socioeconomic History    Marital status: Single   Tobacco Use    Smoking status: Never    Smokeless tobacco: Never   Vaping Use    Vaping Use: Never used   Substance and Sexual Activity    Alcohol use: Yes     Comment: socially    Drug use: Never    Sexual activity: Not Currently     Birth control/protection: OCP     Social Determinants of Health     Financial Resource Strain: Low Risk     Difficulty of Paying Living Expenses: Not hard at all   Food Insecurity: No Food Insecurity    Worried About Programme researcher, broadcasting/film/video in the Last Year: Never true    Ran Out of Food in the Last Year: Never true   Transportation Needs: No Transportation Needs    Lack of Transportation (Medical): No    Lack of Transportation (Non-Medical): No   Physical Activity: Sufficiently Active    Days of Exercise per Week: 4 days    Minutes of Exercise per Session: 50 min   Stress: No Stress Concern Present    Feeling of Stress : Only a little   Social Connections: Moderately Integrated    Frequency of Communication with Friends and Family: More than three times a week    Frequency of Social Gatherings with Friends and Family: Twice a week    Attends Religious Services: More than 4 times per year    Active Member of [employer] or Organizations: Yes    Attends Engineer, structural: More than 4 times per year    Marital Status: Never married   Catering manager Violence: Not At Risk    Fear of Current or Ex-Partner: No    Emotionally Abused: No    Physically Abused: No    Sexually Abused: No   Housing Stability: Low Risk     Unable to Pay for Housing in the Last Year: No    Number of Places Lived in the Last Year: 1    Unstable Housing in the Last Year: No     Family History   Problem Relation Age of Onset    Hypertension Mother      Cancer Maternal Grandmother         breast ca    Hypertension Maternal Grandmother     Breast cancer Maternal Grandmother 69    Cancer Maternal Grandfather         prostate    Hypertension Maternal Grandfather     No known problems Father     Colon cancer Neg Hx     Diabetes Neg Hx     Eclampsia Neg Hx     Miscarriages / Stillbirths Neg Hx     Ovarian cancer Neg Hx     Preterm labor Neg Hx     Stroke Neg Hx        ASSESSMENT/PLAN     Sarah Ball is a 25 y.o. female    Stage manager Complaint   Patient presents with    Rash        Telemedicine Documentation Requirements    Originating site (Ravneet Hefel's location): Patient is  located in the state of [place]  Patient is located in their: Home  Distant site (Provider location): [facility]  Application used: Engineer, building services Visit  Provider and Title: Kittie Plater, FNP  Consent obtained for video telemedicine visit: yes  Language, if applicable and if translator was required: English  Call Start Time: 0830  Call End Time: 0842    1. Rash and nonspecific skin eruption  - clobetasol (TEMOVATE) 0.05 % ointment; Apply topically 2 (two) times daily  Dispense: 30 g; Refill: 0    Status- acute, worsening      Continue benadryl  Add clobetasol  RTC for new or persistent symptoms     No results found for this or any previous visit (from the past 24 hour(s)).      Risk & Benefits of the new medication(s) were explained to the patient (and family) who verbalized understanding & agreed to the treatment plan. Patient (family) encouraged to contact me/clinical staff with any questions/concerns      MEDICATIONS     No outpatient medications have been marked as taking for the 04/01/21 encounter (Telemedicine Visit) with Kittie Plater, FNP.         Allergies   Allergen Reactions    Azithromycin Anaphylaxis    Phenytoin Anaphylaxis    Azo [Phenazopyridine]      rash    Magnesium Rash    Other Nausea And Vomiting and Headaches     Red Dye    Red Dye  Nausea And Vomiting and Other (See Comments)       SUBJECTIVE     Chief Complaint   Patient presents with    Rash          Red itchy rash to right side of neck  Noticed it when at the office yesterday  Took Claritin  Small red bumps  Took a benadryl at 530p- helped for a while   Took a second benadryl at 930p    Still itching today. Denies pain.   Denies heat, sun exposure  Went to pilaties class - unsure if there was fabic rubbing     Last night 0400 - some diaphoresis - took temp 98.7    Rash  This is a new problem. The current episode started yesterday. The affected locations include the neck. The rash is characterized by itchiness. She was exposed to nothing. Pertinent negatives include no cough, fatigue, fever or shortness of breath. Treatments tried: claritin. The treatment provided moderate relief.     ROS     Review of Systems   Constitutional:  Negative for activity change, appetite change, chills, diaphoresis, fatigue, fever and unexpected weight change.   Respiratory:  Negative for apnea, cough, choking, chest tightness, shortness of breath, wheezing and stridor.    Cardiovascular:  Negative for chest pain, palpitations and leg swelling.   Skin:  Positive for rash. Negative for color change and pallor.   Allergic/Immunologic: Negative for immunocompromised state.   All other systems reviewed and are negative.     The following sections were reviewed this encounter by the provider:   Tobacco  Allergies  Meds  Problems  Med Hx  Surg Hx  Fam Hx         PHYSICAL EXAM     There were no vitals filed for this visit.    Physical Exam  Vitals and nursing note reviewed.   Constitutional:       General: She is not in acute  distress.     Appearance: Normal appearance. She is not ill-appearing.   Pulmonary:      Effort: Pulmonary effort is normal.      Comments: No work of breathing noted. Able to speak in full sentences. No episodes of coughing while on video visit.   Skin:     Comments: Area of erythema to  right side of neck, some small papules noted    Neurological:      General: No focal deficit present.      Mental Status: She is alert and oriented to person, place, and time.   Psychiatric:         Mood and Affect: Mood normal.         Behavior: Behavior normal.         Thought Content: Thought content normal.         Judgment: Judgment normal.     Ortho Exam  Neurologic Exam     Mental Status   Oriented to person, place, and time.     PROCEDURE(S)     Procedures        Signed,  Kittie Plater, FNP  04/01/2021

## 2021-05-03 ENCOUNTER — Telehealth (INDEPENDENT_AMBULATORY_CARE_PROVIDER_SITE_OTHER): Payer: BC Managed Care – PPO | Admitting: Nurse Practitioner

## 2021-05-03 ENCOUNTER — Encounter (INDEPENDENT_AMBULATORY_CARE_PROVIDER_SITE_OTHER): Payer: Self-pay | Admitting: Nurse Practitioner

## 2021-05-03 DIAGNOSIS — J0110 Acute frontal sinusitis, unspecified: Secondary | ICD-10-CM

## 2021-05-03 MED ORDER — BENZONATATE 200 MG PO CAPS
200.0000 mg | ORAL_CAPSULE | Freq: Three times a day (TID) | ORAL | 0 refills | Status: DC | PRN
Start: 2021-05-03 — End: 2021-08-11

## 2021-05-03 MED ORDER — AMOXICILLIN-POT CLAVULANATE 875-125 MG PO TABS
1.0000 | ORAL_TABLET | Freq: Two times a day (BID) | ORAL | 0 refills | Status: AC
Start: 2021-05-03 — End: 2021-05-13

## 2021-05-03 NOTE — Progress Notes (Signed)
[facility]    PROGRESS NOTE      Patient: Patrizia Paule   Date: 05/03/2021   MRN: [id number]     Past Medical History:   Diagnosis Date    Anti-NMDA receptor encephalitis 2014     Social History     Socioeconomic History    Marital status: Single   Tobacco Use    Smoking status: Never    Smokeless tobacco: Never   Vaping Use    Vaping Use: Never used   Substance and Sexual Activity    Alcohol use: Yes     Comment: socially    Drug use: Never    Sexual activity: Not Currently     Birth control/protection: OCP     Social Determinants of Health     Financial Resource Strain: Low Risk     Difficulty of Paying Living Expenses: Not hard at all   Food Insecurity: No Food Insecurity    Worried About Programme researcher, broadcasting/film/video in the Last Year: Never true    Ran Out of Food in the Last Year: Never true   Transportation Needs: No Transportation Needs    Lack of Transportation (Medical): No    Lack of Transportation (Non-Medical): No   Physical Activity: Sufficiently Active    Days of Exercise per Week: 4 days    Minutes of Exercise per Session: 50 min   Stress: No Stress Concern Present    Feeling of Stress : Only a little   Social Connections: Moderately Integrated    Frequency of Communication with Friends and Family: More than three times a week    Frequency of Social Gatherings with Friends and Family: Twice a week    Attends Religious Services: More than 4 times per year    Active Member of [employer] or Organizations: Yes    Attends Engineer, structural: More than 4 times per year    Marital Status: Never married   Catering manager Violence: Not At Risk    Fear of Current or Ex-Partner: No    Emotionally Abused: No    Physically Abused: No    Sexually Abused: No   Housing Stability: Low Risk     Unable to Pay for Housing in the Last Year: No    Number of Places Lived in the Last Year: 1    Unstable Housing in the Last Year: No     Family History   Problem Relation Age of Onset    Hypertension Mother      Cancer Maternal Grandmother         breast ca    Hypertension Maternal Grandmother     Breast cancer Maternal Grandmother 58    Cancer Maternal Grandfather         prostate    Hypertension Maternal Grandfather     No known problems Father     Colon cancer Neg Hx     Diabetes Neg Hx     Eclampsia Neg Hx     Miscarriages / Stillbirths Neg Hx     Ovarian cancer Neg Hx     Preterm labor Neg Hx     Stroke Neg Hx        ASSESSMENT/PLAN     Labrina Lines is a 25 y.o. female    Stage manager Complaint   Patient presents with    URI      Telemedicine Documentation Requirements    Originating site (Teletha Schnackenberg's location): Patient is located in  the state of [place]  Patient is located in their: Home  Distant site (Provider location): [facility]  Application used: Engineer, building services Visit  Provider and Title: Kittie Plater, FNP  Consent obtained for video telemedicine visit: yes  Language, if applicable and if translator was required: English  Call Start Time: 1440  Call End Time: 1448    1. Acute non-recurrent frontal sinusitis  - benzonatate (TESSALON) 200 MG capsule; Take 1 capsule (200 mg total) by mouth 3 (three) times daily as needed for Cough  Dispense: 30 capsule; Refill: 0  - amoxicillin-clavulanate (Augmentin) 875-125 MG per tablet; Take 1 tablet by mouth 2 (two) times daily for 10 days  Dispense: 20 tablet; Refill: 0    Status- acute, worsening      Informed patient that their history and physical indicate that they have acute bacterial rhinosinusitis. This can be called a sinus infection. Rhinosinusitis is a symptomatic inflammation of the nasal cavity and sinuses. The most common cause is a viral infection, however due to exam findings and length of illness, the diagnosis of acute bacterial rhinosinusitis has been made.     Informed patients that typical treatment is management of symptoms. Over the counter analgesics and antipyretics such as Acetaminophen or NSAIDs can be used for pain  and fever relief as needed. Saline irrigation may reduce the need for pain medication and improve patient comfort.  Intranasal corticosteroids (Flonase) help to reduce mucosal inflammation and help improve sinus drainage. Oral decongestants (pseudoephedrine) can also be helpful.     An antibiotic has been prescribed for the patient. Informed patient of medication side effects. Instructed to take full course of antibiotics.     Discussed with patient that if symptoms worsen or do not improve to return to clinic or go to nearest emergency department for further evaluation and monitoring. Worsening symptoms include but are not limited to: high fever, vision disturbances, swelling/ redness around the eyes, or severe headache.    The differential diagnosis includes viral URI, pharyngitis, bronchitis, sinusitis, pneumonia, allergic rhinitis, RAD, post-nasal drip, or influenza.       No results found for this or any previous visit (from the past 24 hour(s)).      Risk & Benefits of the new medication(s) were explained to the patient (and family) who verbalized understanding & agreed to the treatment plan. Patient (family) encouraged to contact me/clinical staff with any questions/concerns      MEDICATIONS     Outpatient Medications Marked as Taking for the 05/03/21 encounter (Telemedicine Visit) with Kittie Plater, FNP   Medication Sig Dispense Refill    cetirizine (ZyrTEC Allergy) 10 MG tablet Take 1 tablet (10 mg total) by mouth daily (Patient taking differently: Take 10 mg by mouth as needed) 30 tablet 1    Ferrous Sulfate (IRON PO) Take 125 mg by mouth daily Slow release      fluticasone (FLONASE) 50 MCG/ACT nasal spray 1 spray by Nasal route daily      norethin ace-eth estrad-FE (Junel Fe 24) 1-20 MG-MCG(24) per tablet Take 1 tablet by mouth daily 84 tablet 3    VITAMIN D PO Take 2,000 IU by mouth every other day           Allergies   Allergen Reactions    Azithromycin Anaphylaxis    Phenytoin Anaphylaxis    Azo  [Phenazopyridine]      rash    Magnesium Rash    Other Nausea And Vomiting and  Headaches     Red Dye    Red Dye Nausea And Vomiting and Other (See Comments)       SUBJECTIVE     Chief Complaint   Patient presents with    URI        Took home covid test initially - negative    URI   This is a new problem. Episode onset: 2 weeks ago. The problem has been unchanged. Associated symptoms include congestion, coughing, ear pain, headaches, nausea and a plugged ear sensation (ringing). Pertinent negatives include no chest pain or wheezing. Associated symptoms comments: Cough started 2 weeks ago - all other symptoms started last night  . Treatments tried: mucinex dm until 2 days ago. The treatment provided no relief.     ROS     Review of Systems   Constitutional:  Positive for diaphoresis and fatigue. Negative for activity change, appetite change, chills, fever and unexpected weight change.   HENT:  Positive for congestion and ear pain.    Respiratory:  Positive for cough. Negative for apnea, choking, chest tightness, shortness of breath, wheezing and stridor.    Cardiovascular:  Negative for chest pain, palpitations and leg swelling.   Gastrointestinal:  Positive for nausea.   Musculoskeletal:  Positive for myalgias.   Skin:  Negative for color change and pallor.   Allergic/Immunologic: Negative for immunocompromised state.   Neurological:  Positive for light-headedness and headaches.   All other systems reviewed and are negative.     The following sections were reviewed this encounter by the provider:   Tobacco  Allergies  Meds  Problems  Med Hx  Surg Hx  Fam Hx         PHYSICAL EXAM     There were no vitals filed for this visit.    Physical Exam  Vitals and nursing note reviewed.   Constitutional:       General: She is not in acute distress.     Appearance: Normal appearance. She is not ill-appearing.   Pulmonary:      Effort: Pulmonary effort is normal.      Comments: No work of breathing noted. Able to speak in  full sentences. No episodes of coughing while on video visit.   Neurological:      General: No focal deficit present.      Mental Status: She is alert and oriented to person, place, and time.   Psychiatric:         Mood and Affect: Mood normal.         Behavior: Behavior normal.         Thought Content: Thought content normal.         Judgment: Judgment normal.     Ortho Exam  Neurologic Exam     Mental Status   Oriented to person, place, and time.     PROCEDURE(S)     Procedures        Signed,  Kittie Plater, FNP  05/03/2021

## 2021-06-24 ENCOUNTER — Encounter (INDEPENDENT_AMBULATORY_CARE_PROVIDER_SITE_OTHER): Payer: Self-pay | Admitting: Internal Medicine

## 2021-06-24 ENCOUNTER — Ambulatory Visit (INDEPENDENT_AMBULATORY_CARE_PROVIDER_SITE_OTHER): Payer: BC Managed Care – PPO | Admitting: Internal Medicine

## 2021-06-24 VITALS — BP 131/81 | HR 111 | Temp 99.0°F | Wt 131.0 lb

## 2021-06-24 DIAGNOSIS — J0101 Acute recurrent maxillary sinusitis: Secondary | ICD-10-CM

## 2021-06-24 DIAGNOSIS — R21 Rash and other nonspecific skin eruption: Secondary | ICD-10-CM

## 2021-06-24 MED ORDER — SULFAMETHOXAZOLE-TRIMETHOPRIM 800-160 MG PO TABS
1.0000 | ORAL_TABLET | Freq: Two times a day (BID) | ORAL | 0 refills | Status: AC
Start: 2021-06-24 — End: 2021-07-04

## 2021-06-24 MED ORDER — METHYLPREDNISOLONE 4 MG PO TBPK
ORAL_TABLET | ORAL | 0 refills | Status: DC
Start: 2021-06-24 — End: 2021-08-11

## 2021-06-24 NOTE — Progress Notes (Signed)
Have you seen any specialists/other providers since your last visit with [place]?    Yes    Health Maintenance Due   Topic Date Due    Tetanus Ten-Year  Never done    HPV Series (1 - 2-dose series) Never done    COVID-19 Vaccine (2 - Booster for Janssen series) 09/29/2020    INFLUENZA VACCINE  Never done

## 2021-06-24 NOTE — Progress Notes (Signed)
Subjective:      Date: 06/24/2021 3:06 PM   Patient ID: Sarah Ball is a 25 y.o. female.    Chief Complaint:  Chief Complaint   Patient presents with    Cough     Pt reports having sxs of cough, congestion, and fatigue that has been going on since October.Pt tested negative for Covid at home yesterday. Pt has taken OTC mucinex 2x a day that helps with cough.        HPI:  HPI  Patient is a 25 year old female presenting with complaints of nasal congestion, cough, postnasal drip and sinus pressure worse in the past week.  She reports that she was diagnosed with sinusitis in October and treated with antibiotics and that her symptoms seem to improve but never completely cleared up and then has worsened in the past week.  She also admits to fatigue.  States she did a COVID test today and it was negative.  States has been taking Mucinex which seems to help with the cough.  Denies fevers, shortness of breath and wheezing.  She also reports that she had a rash on her face that is now cleared but was concerned about autoimmune problem  (lupus) and requesting f blood tests.  Problem List:  Patient Active Problem List   Diagnosis    Seasonal allergies    History of palpitations       Current Medications:  Outpatient Medications Marked as Taking for the 06/24/21 encounter (Office Visit) with Kela Millin, MD   Medication Sig Dispense Refill    cetirizine (ZyrTEC Allergy) 10 MG tablet Take 1 tablet (10 mg total) by mouth daily (Patient taking differently: Take 10 mg by mouth as needed) 30 tablet 1    clobetasol (TEMOVATE) 0.05 % ointment Apply topically 2 (two) times daily 30 g 0    Ferrous Sulfate (IRON PO) Take 125 mg by mouth daily Slow release      fluticasone (FLONASE) 50 MCG/ACT nasal spray 1 spray by Nasal route daily      norethin ace-eth estrad-FE (Junel Fe 24) 1-20 MG-MCG(24) per tablet Take 1 tablet by mouth daily 84 tablet 3    nystatin (MYCOSTATIN) cream Apply topically as needed (vulvar itching) 30 g 1     UNABLE TO FIND daily fluticasone Metered Dose Nasal Spray [Flonase] 2 sprays in each nostril daily      VITAMIN D PO Take 2,000 IU by mouth every other day         Allergies:  Allergies   Allergen Reactions    Azithromycin Anaphylaxis    Phenytoin Anaphylaxis    Azo [Phenazopyridine]      rash    Magnesium Rash    Other Nausea And Vomiting and Headaches     Red Dye    Red Dye Nausea And Vomiting and Other (See Comments)       Past Medical History:  Past Medical History:   Diagnosis Date    Anti-NMDA receptor encephalitis 2014       Past Surgical History:  Past Surgical History:   Procedure Laterality Date    TONSILLECTOMY         Family History:  Family History   Problem Relation Age of Onset    Hypertension Mother     Cancer Maternal Grandmother         breast ca    Hypertension Maternal Grandmother     Breast cancer Maternal Grandmother 48    Cancer Maternal Grandfather  prostate    Hypertension Maternal Grandfather     No known problems Father     Colon cancer Neg Hx     Diabetes Neg Hx     Eclampsia Neg Hx     Miscarriages / Stillbirths Neg Hx     Ovarian cancer Neg Hx     Preterm labor Neg Hx     Stroke Neg Hx        Social History:  Social History     Socioeconomic History    Marital status: Single   Tobacco Use    Smoking status: Never    Smokeless tobacco: Never   Vaping Use    Vaping Use: Never used   Substance and Sexual Activity    Alcohol use: Yes     Comment: socially    Drug use: Never    Sexual activity: Not Currently     Birth control/protection: OCP     Social Determinants of Health     Financial Resource Strain: Low Risk     Difficulty of Paying Living Expenses: Not hard at all   Food Insecurity: No Food Insecurity    Worried About Programme researcher, broadcasting/film/video in the Last Year: Never true    Ran Out of Food in the Last Year: Never true   Transportation Needs: No Transportation Needs    Lack of Transportation (Medical): No    Lack of Transportation (Non-Medical): No   Physical Activity: Sufficiently  Active    Days of Exercise per Week: 4 days    Minutes of Exercise per Session: 50 min   Stress: No Stress Concern Present    Feeling of Stress : Only a little   Social Connections: Moderately Integrated    Frequency of Communication with Friends and Family: More than three times a week    Frequency of Social Gatherings with Friends and Family: Twice a week    Attends Religious Services: More than 4 times per year    Active Member of [employer] or Organizations: Yes    Attends Engineer, structural: More than 4 times per year    Marital Status: Never married   Catering manager Violence: Not At Risk    Fear of Current or Ex-Partner: No    Emotionally Abused: No    Physically Abused: No    Sexually Abused: No   Housing Stability: Low Risk     Unable to Pay for Housing in the Last Year: No    Number of Places Lived in the Last Year: 1    Unstable Housing in the Last Year: No        The following sections were reviewed this encounter by the provider:   Tobacco  Allergies  Meds  Problems  Med Hx  Surg Hx  Fam Hx         ROS:  General/Constitutional:   Denies Chills. Denies Fever.   Ophthalmologic:   Denies Blurred vision. Denies Eye Pain.   ENT:   As above denies Ear pain. Denies Sinus pain.   Endocrine:   Denies Polydipsia. Denies Polyuria.   Respiratory:   As above. Denies Shortness of breath. Denies Wheezing.   Cardiovascular:   Denies Chest pain. . Denies Swelling in hands/feet.   Gastrointestinal:   Denies Abdominal pain.Denies Nausea. Denies Vomiting.   Genitourinary:   Denies Blood in urine. Denies Frequent urination. Denies Painful urination.   Musculoskeletal:    As above.  Denies Leg cramps. Denies Muscle aches.  Skin:    Denies skin rash  Neurologic:   Denies Dizziness.  Denies Headache  .     Objective:   Vitals:  BP 131/81 (BP Site: Left arm, Patient Position: Sitting, Cuff Size: Medium)   Pulse (!) 111   Temp 99 F (37.2 C)   Wt 59.4 kg (131 lb)   LMP 06/15/2021 (Exact Date)   SpO2 99%    BMI 23.96 kg/m       Physical Exam:  General Examination:   Physical Exam   GENERAL APPEARANCE: alert, in no acute distress, well developed, well nourished  oriented to time, place, and person.   H EENT: PERRL, EOMI, + maxillary sinus tenderness bilaterally, nares with erythematous/edematous mucosae  Oropharynx: No edema, no exudates.  HEART: S1, S2 normal, no murmurs, rubs, gallops, regular rate and rhythm.   LUNGS: normal effort / no distress, normal breath sounds, clear to auscultation  bilaterally, no wheezes, rales, rhonchi.   ABDOMEN:soft, +BS, NT/ND, no masses palpable  NEURO:CN2-12 grossly intact, nonfocal  PSYCH: alert and oriented to time,place and person.   SKIN: moist, no focal rash    Assessment:       1. Recurrent maxillary sinusitis  - methylPREDNISolone (MEDROL DOSEPAK) 4 MG tablet; follow package directions  Dispense: 21 tablet; Refill: 0  - sulfamethoxazole-trimethoprim (BACTRIM DS) 800-160 MG per tablet; Take 1 tablet by mouth 2 (two) times daily for 10 days  Dispense: 20 tablet; Refill: 0  -Continue supportive care as discussed  2. Rash, transient  - ANA IFA w reflex to Titer/Pattern/Antibody        Plan:         Kela Millin, MD

## 2021-06-25 LAB — ANA IFA W/REFLEX TO TITER/PATTERN/AB
ANA Screen: POSITIVE — AB
ANA Titer: 1:160 {titer} — AB

## 2021-06-25 LAB — PATHOLOGY REVIEW-ANA

## 2021-06-28 ENCOUNTER — Ambulatory Visit (INDEPENDENT_AMBULATORY_CARE_PROVIDER_SITE_OTHER): Payer: BC Managed Care – PPO | Admitting: Internal Medicine

## 2021-06-28 LAB — REFLEX EXTRACTABLE NUCLEAR ANTIGEN ANTIBODY
Centromere Antibody Interpretation: NEGATIVE
Centromere Antibody: 6 U/mL (ref 0–19)
Chromatin Antibody Interp: NEGATIVE
Chromatin Antibody: 7 U/mL (ref 0–19)
Double Stranded DNA(dsDNA)Antibody Interpretation: NEGATIVE
Double Stranded DNA(dsDNA)Antibody: 50 U/mL (ref 0–200)
Histone Antibody Interpretation: NEGATIVE
Histone Antibody: 0.5 U/mL (ref 0.0–0.9)
JO-1 Antibody Interpretation: NEGATIVE
Jo-1 Antibody: 3 U/mL (ref 0–19)
RNA Polymerase III Antibody Interpretation: NEGATIVE
RNA Polymerase III Antibody: 6 U/mL (ref 0–19)
Ribonucleoprotein (RNP) Antibody Interpretation: NEGATIVE
Ribonucleoprotein (RNP) Antibody: 4 U/mL (ref 0–19)
Ribosomal P Antibody Interpretation: NEGATIVE
Ribosomal P Antibody: 3 U/mL (ref 0–19)
Scleroderma (Scl-70) Antibody Interpretation: NEGATIVE
Scleroderma (Scl-70) Antibody: 4 U/mL (ref 0–19)
Sjogrens SSA (RO) Antibody Interpretation: NEGATIVE
Sjogrens SSA (Ro) Antibody: 3 U/mL (ref 0–19)
Sjogrens SSB (La) Antibody Interpretation: NEGATIVE
Sjogrens SSB (La) Antibody: 3 U/mL (ref 0–19)
Smith (Sm) Antibody Interpretation: NEGATIVE
Smith (Sm) Antibody: 5 U/mL (ref 0–19)

## 2021-08-09 ENCOUNTER — Ambulatory Visit (INDEPENDENT_AMBULATORY_CARE_PROVIDER_SITE_OTHER): Payer: BC Managed Care – PPO | Admitting: Internal Medicine

## 2021-08-09 ENCOUNTER — Encounter (INDEPENDENT_AMBULATORY_CARE_PROVIDER_SITE_OTHER): Payer: Self-pay | Admitting: Internal Medicine

## 2021-08-09 VITALS — BP 120/80 | HR 118 | Temp 99.9°F | Wt 141.8 lb

## 2021-08-09 DIAGNOSIS — R42 Dizziness and giddiness: Secondary | ICD-10-CM

## 2021-08-09 DIAGNOSIS — R768 Other specified abnormal immunological findings in serum: Secondary | ICD-10-CM

## 2021-08-09 DIAGNOSIS — R350 Frequency of micturition: Secondary | ICD-10-CM

## 2021-08-09 LAB — CBC AND DIFFERENTIAL
Absolute NRBC: 0 10*3/uL (ref 0.00–0.00)
Basophils Absolute Automated: 0.03 10*3/uL (ref 0.00–0.08)
Basophils Automated: 0.5 %
Eosinophils Absolute Automated: 0.15 10*3/uL (ref 0.00–0.44)
Eosinophils Automated: 2.4 %
Hematocrit: 39.8 % (ref 34.7–43.7)
Hgb: 12.5 g/dL (ref 11.4–14.8)
Immature Granulocytes Absolute: 0.02 10*3/uL (ref 0.00–0.07)
Immature Granulocytes: 0.3 %
Instrument Absolute Neutrophil Count: 3.91 10*3/uL (ref 1.10–6.33)
Lymphocytes Absolute Automated: 1.75 10*3/uL (ref 0.42–3.22)
Lymphocytes Automated: 28.4 %
MCH: 26.8 pg (ref 25.1–33.5)
MCHC: 31.4 g/dL — ABNORMAL LOW (ref 31.5–35.8)
MCV: 85.4 fL (ref 78.0–96.0)
MPV: 10.8 fL (ref 8.9–12.5)
Monocytes Absolute Automated: 0.31 10*3/uL (ref 0.21–0.85)
Monocytes: 5 %
Neutrophils Absolute: 3.91 10*3/uL (ref 1.10–6.33)
Neutrophils: 63.4 %
Nucleated RBC: 0 /100 WBC (ref 0.0–0.0)
Platelets: 393 10*3/uL — ABNORMAL HIGH (ref 142–346)
RBC: 4.66 10*6/uL (ref 3.90–5.10)
RDW: 13 % (ref 11–15)
WBC: 6.17 10*3/uL (ref 3.10–9.50)

## 2021-08-09 LAB — URINALYSIS WITH MICROSCOPIC
Bilirubin, UA: NEGATIVE
Blood, UA: NEGATIVE
Glucose, UA: NEGATIVE
Ketones UA: NEGATIVE
Leukocyte Esterase, UA: NEGATIVE
Nitrite, UA: NEGATIVE
Protein, UR: NEGATIVE
Specific Gravity UA: 1.009 (ref 1.001–1.035)
Urine pH: 6 (ref 5.0–8.0)
Urobilinogen, UA: NORMAL mg/dL

## 2021-08-09 NOTE — Progress Notes (Unsigned)
Have you seen any specialists/other providers since your last visit with [place]?    No    Health Maintenance Due   Topic Date Due    Tetanus Ten-Year  Never done    HPV Series (1 - 2-dose series) Never done    COVID-19 Vaccine (2 - Booster for Janssen series) 09/29/2020    INFLUENZA VACCINE  Never done

## 2021-08-09 NOTE — Progress Notes (Unsigned)
Orthostatic BP  1344 Lying: 124/78 Pulse 88  1346 Sitting: 118/74 Pulse 102  1348 Standing: 120/80 Pulse 118

## 2021-08-11 NOTE — Progress Notes (Signed)
Subjective:      Date: 08/09/2021 2:30 PM   Patient ID: Sarah Ball is a 26 y.o. female.    Chief Complaint:  Chief Complaint   Patient presents with    Dizziness     Pt states she has been feeling faint and weak        HPI:  HPI  Patient is a 26 year old female presenting with complaints of dizziness/lightheadedness and just feeling weak/generalized weakness over the past several days.  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  She admits she has not been hydrating adequately.  She admits to nausea, but no vomiting, also no abdominal pain.  She denies diarrhea  She admits to urinary frequency.  Denies fevers also no URI symptoms.    Problem List:  Patient Active Problem List   Diagnosis    Seasonal allergies    History of palpitations       Current Medications:  Outpatient Medications Marked as Taking for the 08/09/21 encounter (Office Visit) with Kela Millin, MD   Medication Sig Dispense Refill    clobetasol (TEMOVATE) 0.05 % ointment Apply topically 2 (two) times daily 30 g 0    Ferrous Sulfate (IRON PO) Take 125 mg by mouth daily Slow release      fluticasone (FLONASE) 50 MCG/ACT nasal spray 1 spray by Nasal route daily      loratadine (CLARITIN) 10 MG tablet Take 10 mg by mouth daily prn      norethin ace-eth estrad-FE (Junel Fe 24) 1-20 MG-MCG(24) per tablet Take 1 tablet by mouth daily 84 tablet 3    nystatin (MYCOSTATIN) cream Apply topically as needed (vulvar itching) 30 g 1    VITAMIN D PO Take 2,000 IU by mouth every other day         Allergies:  Allergies   Allergen Reactions    Azithromycin Anaphylaxis    Phenytoin Anaphylaxis    Azo [Phenazopyridine]      rash    Magnesium Rash    Other Nausea And Vomiting and Headaches     Red Dye    Red Dye Nausea And Vomiting and Other (See Comments)       Past Medical History:  Past Medical History:   Diagnosis Date    Anti-NMDA  receptor encephalitis 2014       Past Surgical History:  Past Surgical History:   Procedure Laterality Date    TONSILLECTOMY         Family History:  Family History   Problem Relation Age of Onset    Hypertension Mother     Cancer Maternal Grandmother         breast ca    Hypertension Maternal Grandmother     Breast cancer Maternal Grandmother 27    Cancer Maternal Grandfather         prostate    Hypertension Maternal Grandfather     No known problems Father     Colon cancer Neg Hx     Diabetes Neg Hx     Eclampsia Neg Hx     Miscarriages / Stillbirths Neg Hx     Ovarian cancer Neg Hx     Preterm labor Neg Hx     Stroke Neg Hx        Social History:  Social History     Socioeconomic History    Marital  status: Single   Tobacco Use    Smoking status: Never    Smokeless tobacco: Never   Vaping Use    Vaping Use: Never used   Substance and Sexual Activity    Alcohol use: Yes     Comment: socially    Drug use: Never    Sexual activity: Not Currently     Birth control/protection: OCP     Social Determinants of Health     Financial Resource Strain: Low Risk     Difficulty of Paying Living Expenses: Not hard at all   Food Insecurity: No Food Insecurity    Worried About Programme researcher, broadcasting/film/video in the Last Year: Never true    Ran Out of Food in the Last Year: Never true   Transportation Needs: No Transportation Needs    Lack of Transportation (Medical): No    Lack of Transportation (Non-Medical): No   Physical Activity: Sufficiently Active    Days of Exercise per Week: 4 days    Minutes of Exercise per Session: 50 min   Stress: No Stress Concern Present    Feeling of Stress : Only a little   Social Connections: Moderately Integrated    Frequency of Communication with Friends and Family: More than three times a week    Frequency of Social Gatherings with Friends and Family: Twice a week    Attends Religious Services: More than 4 times per year    Active Member of [employer] or Organizations: Yes    Attends Hospital doctor: More than 4 times per year    Marital Status: Never married   Catering manager Violence: Not At Risk    Fear of Current or Ex-Partner: No    Emotionally Abused: No    Physically Abused: No    Sexually Abused: No   Housing Stability: Low Risk     Unable to Pay for Housing in the Last Year: No    Number of Places Lived in the Last Year: 1    Unstable Housing in the Last Year: No        The following sections were reviewed this encounter by the provider:   Tobacco   Allergies   Meds   Problems   Med Hx   Surg Hx   Fam Hx          ROS:  General/Constitutional:   Denies Chills. Denies Fatigue. Denies Fever.   Ophthalmologic:   Denies Blurred vision. Denies Eye Pain.   ENT:   Denies Nasal Discharge. Denies Ear pain. Denies Sinus pain.   Endocrine:   Denies Polydipsia. Denies Polyuria.   Respiratory:   Denies Cough.. Denies Shortness of breath. Denies Wheezing.   Cardiovascular:   Denies Chest pain. . Denies Swelling in hands/feet.   Gastrointestinal:   As above Denies Vomiting.   Genitourinary:   + Urinary frequency, denies Blood in urine. Denies Painful urination.   Musculoskeletal:    As above.  Denies Leg cramps. Denies Muscle aches.   Skin:    Denies skin rash  Neurologic:   Denies Dizziness.  Denies Headache      Objective:   Vitals:  BP 120/80 (BP Site: Right arm, Patient Position: Standing, Cuff Size: Medium)    Pulse (!) 118    Temp 99.9 F (37.7 C)    Wt 64.3 kg (141 lb 12.8 oz)    LMP 07/16/2021 (Approximate)    SpO2 93%    BMI 25.94 kg/m  Physical Exam:  General Examination:   Physical Exam   GENERAL APPEARANCE: alert, in no acute distress, well developed, well nourished  oriented to time, place, and person.   HEENT: PERRL, EOMI.  Sclera anicteric.    Oropharynx: Slightly dry mucous membranes.  HEART: S1, S2 normal,regular rate and rhythm.   LUNGS: normal effort / no distress, normal breath sounds, clear to auscultation  bilaterally, no wheezes, rales, rhonchi.   ABDOMEN:soft, +BS, NT/ND, no  masses palpable  EXTREMITIES: No LE edema bilaterally.   NEURO:CN2-12 grossly intact, nonfocal  PSYCH: alert and oriented to time,place and person.   SKIN: moist, no focal rash  .  Recent Results (from the past 2016 hour(s))   ANA IFA w reflex to Titer/Pattern/Antibody    Collection Time: 06/24/21 10:56 AM   Result Value Ref Range    ANA Screen Positive (A) Negative    ANA Titer 1:160 (A)     ANA Pattern Speckled (A)    Reflex Extractable Nuclear Antigen Antibody    Collection Time: 06/24/21 10:56 AM   Result Value Ref Range    Sjogrens SSA (Ro) Antibody 3 0 - 19 U/mL    Sjogrens SSA (RO) Antibody Interpretation Negative     Sjogrens SSB (La) Antibody 3 0 - 19 U/mL    Sjogrens SSB (La) Antibody Interpretation Negative     Smith (Sm) Antibody 5 0 - 19 U/mL    Smith (Sm) Antibody Interpretation Negative     Ribonucleoprotein (RNP) Antibody 4 0 - 19 U/mL    Ribonucleoprotein (RNP) Antibody Interpretation Negative     Scleroderma (Scl-70) Antibody 4 0 - 19 U/mL    Scleroderma (Scl-70) Antibody Interpretation Negative     Jo-1 Antibody 3 0 - 19 U/mL    JO-1 Antibody Interpretation Negative     Double Stranded DNA(dsDNA)Antibody 50 0 - 200 U/mL    Double Stranded DNA(dsDNA)Antibody Interpretation Negative     Centromere Antibody 6 0 - 19 U/mL    Centromere Antibody Interpretation Negative     Histone Antibody 0.5 0.0 - 0.9 U/mL    Histone Antibody Interpretation Negative     Chromatin Antibody 7 0 - 19 U/mL    Chromatin Antibody Interp Negative     Ribosomal P Antibody 3 0 - 19 U/mL    Ribosomal P Antibody Interpretation Negative     RNA Polymerase III Antibody 6 0 - 19 U/mL    RNA Polymerase III Antibody Interpretation Negative    Pathology Review-ANA    Collection Time: 06/24/21 10:56 AM   Result Value Ref Range    Pathology Review-ANA See Note    CBC and differential    Collection Time: 08/09/21  2:28 PM   Result Value Ref Range    WBC 6.17 3.10 - 9.50 x10 3/uL    Hgb 12.5 11.4 - 14.8 g/dL    Hematocrit 16.1 09.6 -  43.7 %    Platelets 393 (H) 142 - 346 x10 3/uL    RBC 4.66 3.90 - 5.10 x10 6/uL    MCV 85.4 78.0 - 96.0 fL    MCH 26.8 25.1 - 33.5 pg    MCHC 31.4 (L) 31.5 - 35.8 g/dL    RDW 13 11 - 15 %    MPV 10.8 8.9 - 12.5 fL    Instrument Absolute Neutrophil Count 3.91 1.10 - 6.33 x10 3/uL    Neutrophils 63.4 None %    Lymphocytes Automated 28.4 None %    Monocytes 5.0  None %    Eosinophils Automated 2.4 None %    Basophils Automated 0.5 None %    Immature Granulocytes 0.3 None %    Nucleated RBC 0.0 0.0 - 0.0 /100 WBC    Neutrophils Absolute 3.91 1.10 - 6.33 x10 3/uL    Lymphocytes Absolute Automated 1.75 0.42 - 3.22 x10 3/uL    Monocytes Absolute Automated 0.31 0.21 - 0.85 x10 3/uL    Eosinophils Absolute Automated 0.15 0.00 - 0.44 x10 3/uL    Basophils Absolute Automated 0.03 0.00 - 0.08 x10 3/uL    Immature Granulocytes Absolute 0.02 0.00 - 0.07 x10 3/uL    Absolute NRBC 0.00 0.00 - 0.00 x10 3/uL   Urinalysis with microscopic    Collection Time: 08/09/21  2:28 PM   Result Value Ref Range    Urine Type Clean Catch     Color, UA Colorless     Clarity, UA Clear Clear - Hazy    Specific Gravity UA 1.009 1.001 - 1.035    Urine pH 6.0 5.0 - 8.0    Leukocyte Esterase, UA Negative Negative    Nitrite, UA Negative Negative    Protein, UR Negative Negative    Glucose, UA Negative Negative    Ketones UA Negative Negative    Urobilinogen, UA Normal mg/dL    Bilirubin, UA Negative Negative    Blood, UA Negative Negative    RBC, UA 0-2 /hpf    WBC, UA 0-5 0 - 5 /hpf    Squamous Epithelial Cells, Urine 0-5 0 - 25 /hpf    Urine Mucus Present None               Assessment:       1. Positive ANA (antinuclear antibody)  -Above results reviewed, reflex testing negative  - Ambulatory referral to Rheumatology    2. Orthostatic dizziness  - CBC and differential  - ECG 12 lead>> no acute ischemic changes  -Rest, optimize hydration recommended 64 to 80 ounces of water per day follow-up as needed  -  3. Urinary frequency  - Urine culture  -  Urinalysis with microscopic above UA negative, await urine culture.        Plan:   As above  Return if symptoms worsen or fail to improve.      Kela Millin, MD

## 2021-08-18 ENCOUNTER — Encounter (INDEPENDENT_AMBULATORY_CARE_PROVIDER_SITE_OTHER): Payer: Self-pay | Admitting: Internal Medicine

## 2021-08-20 ENCOUNTER — Encounter (INDEPENDENT_AMBULATORY_CARE_PROVIDER_SITE_OTHER): Payer: Self-pay | Admitting: Internal Medicine

## 2021-08-20 ENCOUNTER — Other Ambulatory Visit (FREE_STANDING_LABORATORY_FACILITY): Payer: BC Managed Care – PPO

## 2021-08-20 ENCOUNTER — Ambulatory Visit (INDEPENDENT_AMBULATORY_CARE_PROVIDER_SITE_OTHER): Payer: BC Managed Care – PPO | Admitting: Internal Medicine

## 2021-08-20 VITALS — BP 130/82 | HR 118 | Temp 97.4°F | Resp 16 | Ht 62.5 in | Wt 144.6 lb

## 2021-08-20 DIAGNOSIS — R5382 Chronic fatigue, unspecified: Secondary | ICD-10-CM

## 2021-08-20 DIAGNOSIS — Z79899 Other long term (current) drug therapy: Secondary | ICD-10-CM

## 2021-08-20 DIAGNOSIS — R768 Other specified abnormal immunological findings in serum: Secondary | ICD-10-CM

## 2021-08-20 DIAGNOSIS — R21 Rash and other nonspecific skin eruption: Secondary | ICD-10-CM

## 2021-08-20 DIAGNOSIS — R42 Dizziness and giddiness: Secondary | ICD-10-CM

## 2021-08-20 LAB — SEDIMENTATION RATE: Sed Rate: 6 mm/Hr (ref 0–20)

## 2021-08-20 LAB — C-REACTIVE PROTEIN: C-Reactive Protein: 0.1 mg/dL (ref 0.0–1.1)

## 2021-08-20 LAB — CK: Creatine Kinase (CK): 118 U/L (ref 29–233)

## 2021-08-20 LAB — RHEUMATOID FACTOR: Rheumatoid Factor: <13 IU/mL (ref 0.0–30.0)

## 2021-08-20 NOTE — Patient Instructions (Signed)
1. Order labs   2. Follow neurology  3. Continue advil as needed  4. Continue exercise   5. Follow 4-6 weeks or early

## 2021-08-20 NOTE — Progress Notes (Signed)
Initial Rheumatology Consultation    Chief Complaint:     Chief Complaint   Patient presents with    Positive Ana     Discuss labs, sx fatigue 04/2021, intermittent numbness of tongue, swollen lymph noes (L) axillary and anterior neck, skin rashes induce by stress          HPI:   This patient is a 26 y.o. year old female referred by Kela Millin, MD for evaluation of joint pain and positive ANA.     She has fatigue and has intermittent rash of chest and abdomen.   She has intermittent numbness of tongue and swollen lymph nodes. She has low grade fever. She takes advil.   She complaints of dizziness/lightheadedness and just feeling weak/generalized weakness over the past several months.  She has followed with her PMD.     She has no joint pain or swelling.     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 She is going to travel in [place].   She does pilates.     She has no children. No F/H of SLE or RA.         PMSH:     Past Medical History:   Diagnosis Date    Anti-NMDA receptor encephalitis 2014       Past Surgical History:   Procedure Laterality Date    TONSILLECTOMY         Allergies:     Allergies   Allergen Reactions    Azithromycin Anaphylaxis    Phenytoin Anaphylaxis    Azo [Phenazopyridine]      rash    Magnesium Rash    Other Nausea And Vomiting and Headaches     Red Dye    Red Dye Nausea And Vomiting and Other (See Comments)       Meds:     Current Outpatient Medications:     clobetasol (TEMOVATE) 0.05 % ointment, Apply topically 2 (two) times daily, Disp: 30 g, Rfl: 0    Ferrous Sulfate (IRON PO), Take 125 mg by mouth daily Slow release, Disp: , Rfl:     fluticasone (FLONASE) 50 MCG/ACT nasal spray, 1 spray by Nasal route daily, Disp: , Rfl:     loratadine (CLARITIN) 10 MG tablet, Take 10 mg by mouth daily prn, Disp: , Rfl:     norethin ace-eth estrad-FE (Junel Fe 24) 1-20 MG-MCG(24) per tablet, Take 1 tablet by mouth daily, Disp: 84 tablet, Rfl: 3    nystatin (MYCOSTATIN) cream,  Apply topically as needed (vulvar itching), Disp: 30 g, Rfl: 1    VITAMIN D PO, Take 2,000 IU by mouth every other day, Disp: , Rfl:     cetirizine (ZyrTEC Allergy) 10 MG tablet, Take 1 tablet (10 mg total) by mouth daily (Patient taking differently: Take 10 mg by mouth as needed), Disp: 30 tablet, Rfl: 1    FH:     Family History   Problem Relation Age of Onset    Hypertension Mother     Cancer Maternal Grandmother         breast ca    Hypertension Maternal Grandmother     Breast cancer Maternal Grandmother 34    Cancer Maternal Grandfather         prostate    Hypertension Maternal Grandfather     No known problems Father     Colon cancer Neg Hx     Diabetes Neg Hx     Eclampsia Neg Hx  Miscarriages / Stillbirths Neg Hx     Ovarian cancer Neg Hx     Preterm labor Neg Hx     Stroke Neg Hx        SH:     Social History     Socioeconomic History    Marital status: Single   Tobacco Use    Smoking status: Never    Smokeless tobacco: Never   Vaping Use    Vaping Use: Never used   Substance and Sexual Activity    Alcohol use: Yes     Comment: socially    Drug use: Never    Sexual activity: Not Currently     Birth control/protection: OCP     Social Determinants of Health     Financial Resource Strain: Low Risk     Difficulty of Paying Living Expenses: Not hard at all   Food Insecurity: No Food Insecurity    Worried About Programme researcher, broadcasting/film/video in the Last Year: Never true    Ran Out of Food in the Last Year: Never true   Transportation Needs: No Transportation Needs    Lack of Transportation (Medical): No    Lack of Transportation (Non-Medical): No   Physical Activity: Insufficiently Active    Days of Exercise per Week: 3 days    Minutes of Exercise per Session: 30 min   Stress: No Stress Concern Present    Feeling of Stress : Only a little   Social Connections: Moderately Integrated    Frequency of Communication with Friends and Family: More than three times a week    Frequency of Social Gatherings with Friends and  Family: Three times a week    Attends Religious Services: More than 4 times per year    Active Member of Clubs or Organizations: Yes    Attends Engineer, structural: More than 4 times per year    Marital Status: Never married   Catering manager Violence: Not At Risk    Fear of Current or Ex-Partner: No    Emotionally Abused: No    Physically Abused: No    Sexually Abused: No   Housing Stability: Low Risk     Unable to Pay for Housing in the Last Year: No    Number of Places Lived in the Last Year: 1    Unstable Housing in the Last Year: No       ROS:   All other systems reviewed and negative except as described above.    Denies chills or night sweats.  Denies weight loss. Denies   malar rash or photosensitivity, discoid lesions, psoriasis, nail changes or Raynaud's. Denies hair loss or alopecia. Denies Sicca symptoms, nasal or oral ulcers or epistaxis. Denies cough, shortness of breath, chest pain, palpation or exercise intolerance. Denies nausea, vomiting, diarrhea, melena, hematochezia or dysphagia.  Denies dysuria and hematuria. Denies focal weakness or seizure activity.         PHYSICAL EXAM:     Vitals:    08/20/21 1455   BP: 130/82   Pulse:    Resp:    Temp:    SpO2:        General appearance - alert, well appearing, and in no distress  Mental status - alert, oriented to person, place, and time  Eyes - extraocular eye movements intact  Mouth - mucous membranes moist, pharynx normal without lesions  Neck - supple, no significant adenopathy  Lymphatics - no palpable lymphadenopathy, no hepatosplenomegaly   Cervical spine -  full range of motion, no tenderness of para spine  Lumbar spine - full range of motion, no tenderness of para spine  Neurological - alert, oriented, normal speech, no focal findings or movement disorder noted  Extremities - peripheral pulses normal, no pedal edema, no clubbing or cyanosis  Skin - normal coloration and turgor, no rashes, no suspicious skin lesions  noted  Musculoskeletal - No crepitus on TMJ auscultation.  No tenderness of MCPs and PIPs. Good range of motion of shoulders, elbows, wrists and small joints of hands. Good range of motion of hips, knees and ankles.  Knees crepitus with range of motion of knees without effusions. Ankles without effusions.  No tenderness of MTPs or effusions.      Joint Review:    Blank = Absent  X = Present    Patient Right                               Patient Left  Joint Pain/Tend. Swelling Pain/Tend. Swelling   Shoulder       Elbow       Wrist       MCP I       MCP II       MCP III       MCP IV       MCP V       IP       PIP II       PIP III       PIP IV       PIP V       Knee           Tender Joint Count  =   Swollen Joint Count =          Labs:     Component      Latest Ref Rng & Units 06/24/2021 08/09/2021   WBC      3.10 - 9.50 x10 3/uL  6.17   Hemoglobin      11.4 - 14.8 g/dL  29.5   Hematocrit      34.7 - 43.7 %  39.8   Platelet Count      142 - 346 x10 3/uL  393 (H)   RBC      3.90 - 5.10 x10 6/uL  4.66   MCV      78.0 - 96.0 fL  85.4   MCH      25.1 - 33.5 pg  26.8   MCHC      31.5 - 35.8 g/dL  62.1 (L)   RDW      11 - 15 %  13   MPV      8.9 - 12.5 fL  10.8   Instrument Absolute Neutrophil Count      1.10 - 6.33 x10 3/uL  3.91   Neutrophils      None %  63.4   Lymphocytes Automated      None %  28.4   Monocytes      None %  5.0   Eosinophils Automated      None %  2.4   Basophils Automated      None %  0.5   Immature Granulocytes      None %  0.3   Nucleated RBC      0.0 - 0.0 /100 WBC  0.0   Neutrophils Absolute      1.10 -  6.33 x10 3/uL  3.91   Lymphocytes Absolute Automated      0.42 - 3.22 x10 3/uL  1.75   Monocytes Absolute Automated      0.21 - 0.85 x10 3/uL  0.31   Eosinophils Absolute Automated      0.00 - 0.44 x10 3/uL  0.15   Basophils Absolute Automated      0.00 - 0.08 x10 3/uL  0.03   Immature Granulocytes Absolute      0.00 - 0.07 x10 3/uL  0.02   Nucleated RBC Absolute      0.00 - 0.00 x10 3/uL  0.00    Sjogrens SSA (Ro) Antibody      0 - 19 U/mL 3    Sjogrens SSA (RO) Antibody Interpretation       Negative    Sjogrens SSB (La) Antibody      0 - 19 U/mL 3    Sjogrens SSB (La) Antibody Interpretation       Negative    Smith (Sm) Antibody      0 - 19 U/mL 5    Smith (Sm) Antibody Interpretation       Negative    Ribonucleoprotein (RNP) Antibody      0 - 19 U/mL 4    Ribonucleoprotein (RNP) Antibody Interpretation       Negative    Scleroderma (Scl-70) Antibody      0 - 19 U/mL 4    Scleroderma (Scl-70) Antibody Interpretation       Negative    Jo-1 Antibody      0 - 19 U/mL 3    JO-1 Antibody Interpretation       Negative    Double Stranded DNA(dsDNA)Antibody      0 - 200 U/mL 50    Double Stranded DNA(dsDNA)Antibody Interpretation       Negative    Centromere Antibody      0 - 19 U/mL 6    Centromere Antibody Interpretation       Negative    Histone Antibody      0.0 - 0.9 U/mL 0.5    Histone Antibody Interpretation       Negative    Chromatin Antibody      0 - 19 U/mL 7    Chromatin Antibody Interp       Negative    Ribosomal P Antibody      0 - 19 U/mL 3    Ribosomal P Antibody Interpretation       Negative    RNA Polymerase III Antibody      0 - 19 U/mL 6    RNA Polymerase III Antibody Interpretation       Negative    Urine Type        Clean Catch   Color, UA        Colorless   Clarity, UA      Clear - Hazy  Clear   Specific Gravity, UA      1.001 - 1.035  1.009   Urine pH      5.0 - 8.0  6.0   Leukocyte Esterase, UA      Negative  Negative   Nitrite, UA      Negative  Negative   Protein, UR      Negative  Negative   Glucose, UA      Negative  Negative   Ketones UA      Negative  Negative   Urobilinogen, UA  mg/dL  Normal   Bilirubin, UA      Negative  Negative   Blood, UA      Negative  Negative   RBC UA      /hpf  0-2   WBC, UA      0 - 5 /hpf  0-5   Squamous Epithelial Cells, Urine      0 - 25 /hpf  0-5   Urine Mucus      None  Present   ANA Screen      Negative Positive (A)    ANA Titer       1:160 (A)     ANA Pattern       Speckled (A)    Pathology Review-ANA       See Note        Radiology:      US Pelvis Complete [[id number]] (Order [id number])  Status: Final result     Study Result    Narrative & Impression   Indication: Dysmenorrhea and menorrhagia. Anemia.        No prior study for comparison.     TECHNIQUE: Transabdominal imaging.. Transvaginal imaging was not  clinically appropriate.     FINDINGS: The uterus is normal in size and measures 6.7 x 2.9 x 4.4 cm  for total volume of 44 cc's. Endometrial stripe is normal and measures 5  mm.  Right ovary is normal and measures 2.1 x 1.8 x 1.8 cm. Follicular cysts.  Dominant follicle measures 11 mm. Left ovary is normal and measures 2.6  x 1.5 x 1.2 cm and contains small immature follicles.  No free fluid.     IMPRESSION:    Normal study          ASSESSMENT:   Sarah Ball is a 26 y.o. female.       She has fatigue and has intermittent rash/swollen lymph nodes. She has borderline +ANA. Negative ANA panel. Less likely she has active SLE.   She has intermittent numbness of tongue and dizziness/lightheadedness.     PLAN:   1. Order labs   2. Follow neurology  3. Continue advil as needed  4. Continue exercise   5. Follow 4-6 weeks or early

## 2021-08-30 ENCOUNTER — Encounter (INDEPENDENT_AMBULATORY_CARE_PROVIDER_SITE_OTHER): Payer: Self-pay | Admitting: Internal Medicine

## 2021-09-08 ENCOUNTER — Telehealth (INDEPENDENT_AMBULATORY_CARE_PROVIDER_SITE_OTHER): Payer: BC Managed Care – PPO | Admitting: Internal Medicine

## 2021-09-08 DIAGNOSIS — R5382 Chronic fatigue, unspecified: Secondary | ICD-10-CM

## 2021-09-08 DIAGNOSIS — R768 Other specified abnormal immunological findings in serum: Secondary | ICD-10-CM

## 2021-09-08 DIAGNOSIS — R21 Rash and other nonspecific skin eruption: Secondary | ICD-10-CM

## 2021-09-08 DIAGNOSIS — Z87898 Personal history of other specified conditions: Secondary | ICD-10-CM

## 2021-09-08 DIAGNOSIS — Z79899 Other long term (current) drug therapy: Secondary | ICD-10-CM

## 2021-09-08 NOTE — Progress Notes (Signed)
Initial Rheumatology Consultation    Chief Complaint:     Chief Complaint   Patient presents with    Positive Ana     F/u lab results         HPI:   This patient is a 26 y.o. year old female referred by Kela MillinViyuoh, Adeline C, MD for evaluation of joint pain and positive ANA.     Verbal consent has been obtained from the patient to conduct a Video visit encounter to minimize exposure to COVID-19.     She has fatigue and has intermittent rash of chest and abdomen.   She has intermittent numbness of tongue and swollen lymph nodes. She has low grade fever. She takes advil.   She complaints of dizziness/lightheadedness and just feeling weak/generalized weakness over the past several months.  She has followed with her PMD.     She has no joint pain or swelling.     She is part of the [employer]cho[employer] division. [place] going [place] in [place]   She does pilates.     She has no children. No F/H of SLE or RA.         PMSH:     Past Medical History:   Diagnosis Date    Anti-NMDA receptor encephalitis 2014       Past Surgical History:   Procedure Laterality Date    TONSILLECTOMY         Allergies:     Allergies   Allergen Reactions    Azithromycin Anaphylaxis    Phenytoin Anaphylaxis    Azo [Phenazopyridine]      rash    Magnesium Rash    Other Nausea And Vomiting and Headaches     Red Dye    Red Dye Nausea And Vomiting and Other (See Comments)       Meds:     Current Outpatient Medications:     cetirizine (ZyrTEC Allergy) 10 MG tablet, Take 1 tablet (10 mg total) by mouth daily (Patient taking differently: Take 10 mg by mouth as needed), Disp: 30 tablet, Rfl: 1    clobetasol (TEMOVATE) 0.05 % ointment, Apply topically 2 (two) times daily, Disp: 30 g, Rfl: 0    Ferrous Sulfate (IRON PO), Take 125 mg by mouth daily Slow release, Disp: , Rfl:     fluticasone (FLONASE) 50 MCG/ACT nasal spray, 1 spray by Nasal route daily, Disp: , Rfl:     loratadine (CLARITIN) 10 MG tablet, Take 10 mg by mouth daily prn, Disp: ,  Rfl:     norethin ace-eth estrad-FE (Junel Fe 24) 1-20 MG-MCG(24) per tablet, Take 1 tablet by mouth daily, Disp: 84 tablet, Rfl: 3    nystatin (MYCOSTATIN) cream, Apply topically as needed (vulvar itching), Disp: 30 g, Rfl: 1    VITAMIN D PO, Take 2,000 IU by mouth every other day, Disp: , Rfl:     FH:     Family History   Problem Relation Age of Onset    Hypertension Mother     Cancer Maternal Grandmother         breast ca    Hypertension Maternal Grandmother     Breast cancer Maternal Grandmother [id number]    Cancer Maternal Grandfather         prostate    Hypertension Maternal Grandfather     No known problems Father     Colon cancer Neg Hx     Diabetes Neg Hx     Eclampsia Neg  Hx     Miscarriages / Stillbirths Neg Hx     Ovarian cancer Neg Hx     Preterm labor Neg Hx     Stroke Neg Hx        SH:     Social History     Socioeconomic History    Marital status: Single   Tobacco Use    Smoking status: Never    Smokeless tobacco: Never   Vaping Use    Vaping Use: Never used   Substance and Sexual Activity    Alcohol use: Yes     Comment: socially    Drug use: Never    Sexual activity: Not Currently     Birth control/protection: OCP     Social Determinants of Health     Financial Resource Strain: Low Risk     Difficulty of Paying Living Expenses: Not hard at all   Food Insecurity: No Food Insecurity    Worried About Programme researcher, broadcasting/film/video in the Last Year: Never true    Ran Out of Food in the Last Year: Never true   Transportation Needs: No Transportation Needs    Lack of Transportation (Medical): No    Lack of Transportation (Non-Medical): No   Physical Activity: Insufficiently Active    Days of Exercise per Week: 3 days    Minutes of Exercise per Session: 30 min   Stress: No Stress Concern Present    Feeling of Stress : Only a little   Social Connections: Moderately Integrated    Frequency of Communication with Friends and Family: More than three times a week    Frequency of Social Gatherings with Friends and Family: Three  times a week    Attends Religious Services: More than 4 times per year    Active Member of Clubs or Organizations: Yes    Attends Engineer, structural: More than 4 times per year    Marital Status: Never married   Catering manager Violence: Not At Risk    Fear of Current or Ex-Partner: No    Emotionally Abused: No    Physically Abused: No    Sexually Abused: No   Housing Stability: Low Risk     Unable to Pay for Housing in the Last Year: No    Number of Places Lived in the Last Year: 1    Unstable Housing in the Last Year: No       ROS:   All other systems reviewed and negative except as described above.    Denies chills or night sweats.  Denies weight loss. Denies   malar rash or photosensitivity, discoid lesions, psoriasis, nail changes or Raynaud's. Denies hair loss or alopecia. Denies Sicca symptoms, nasal or oral ulcers or epistaxis. Denies cough, shortness of breath, chest pain, palpation or exercise intolerance. Denies nausea, vomiting, diarrhea, melena, hematochezia or dysphagia.  Denies dysuria and hematuria. Denies focal weakness or seizure activity.         PHYSICAL EXAM:         Labs:     Component      Latest Ref Rng & Units 06/24/2021 08/09/2021   WBC      3.10 - 9.50 x10 3/uL  6.17   Hemoglobin      11.4 - 14.8 g/dL  09.8   Hematocrit      34.7 - 43.7 %  39.8   Platelet Count      142 - 346 x10 3/uL  393 (H)  RBC      3.90 - 5.10 x10 6/uL  4.66   MCV      78.0 - 96.0 fL  85.4   MCH      25.1 - 33.5 pg  26.8   MCHC      31.5 - 35.8 g/dL  12.8 (L)   RDW      11 - 15 %  13   MPV      8.9 - 12.5 fL  10.8   Instrument Absolute Neutrophil Count      1.10 - 6.33 x10 3/uL  3.91   Neutrophils      None %  63.4   Lymphocytes Automated      None %  28.4   Monocytes      None %  5.0   Eosinophils Automated      None %  2.4   Basophils Automated      None %  0.5   Immature Granulocytes      None %  0.3   Nucleated RBC      0.0 - 0.0 /100 WBC  0.0   Neutrophils Absolute      1.10 - 6.33 x10 3/uL  3.91    Lymphocytes Absolute Automated      0.42 - 3.22 x10 3/uL  1.75   Monocytes Absolute Automated      0.21 - 0.85 x10 3/uL  0.31   Eosinophils Absolute Automated      0.00 - 0.44 x10 3/uL  0.15   Basophils Absolute Automated      0.00 - 0.08 x10 3/uL  0.03   Immature Granulocytes Absolute      0.00 - 0.07 x10 3/uL  0.02   Nucleated RBC Absolute      0.00 - 0.00 x10 3/uL  0.00   Sjogrens SSA (Ro) Antibody      0 - 19 U/mL 3    Sjogrens SSA (RO) Antibody Interpretation       Negative    Sjogrens SSB (La) Antibody      0 - 19 U/mL 3    Sjogrens SSB (La) Antibody Interpretation       Negative    Smith (Sm) Antibody      0 - 19 U/mL 5    Smith (Sm) Antibody Interpretation       Negative    Ribonucleoprotein (RNP) Antibody      0 - 19 U/mL 4    Ribonucleoprotein (RNP) Antibody Interpretation       Negative    Scleroderma (Scl-70) Antibody      0 - 19 U/mL 4    Scleroderma (Scl-70) Antibody Interpretation       Negative    Jo-1 Antibody      0 - 19 U/mL 3    JO-1 Antibody Interpretation       Negative    Double Stranded DNA(dsDNA)Antibody      0 - 200 U/mL 50    Double Stranded DNA(dsDNA)Antibody Interpretation       Negative    Centromere Antibody      0 - 19 U/mL 6    Centromere Antibody Interpretation       Negative    Histone Antibody      0.0 - 0.9 U/mL 0.5    Histone Antibody Interpretation       Negative    Chromatin Antibody      0 - 19 U/mL 7    Chromatin Antibody  Interp       Negative    Ribosomal P Antibody      0 - 19 U/mL 3    Ribosomal P Antibody Interpretation       Negative    RNA Polymerase III Antibody      0 - 19 U/mL 6    RNA Polymerase III Antibody Interpretation       Negative    Urine Type        Clean Catch   Color, UA        Colorless   Clarity, UA      Clear - Hazy  Clear   Specific Gravity, UA      1.001 - 1.035  1.009   Urine pH      5.0 - 8.0  6.0   Leukocyte Esterase, UA      Negative  Negative   Nitrite, UA      Negative  Negative   Protein, UR      Negative  Negative   Glucose, UA       Negative  Negative   Ketones UA      Negative  Negative   Urobilinogen, UA      mg/dL  Normal   Bilirubin, UA      Negative  Negative   Blood, UA      Negative  Negative   RBC UA      /hpf  0-2   WBC, UA      0 - 5 /hpf  0-5   Squamous Epithelial Cells, Urine      0 - 25 /hpf  0-5   Urine Mucus      None  Present   ANA Screen      Negative Positive (A)    ANA Titer       1:160 (A)    ANA Pattern       Speckled (A)    Pathology Review-ANA       See Note      Component      Latest Ref Rng & Units 06/24/2021 08/09/2021 08/20/2021   WBC      3.10 - 9.50 x10 3/uL  6.17    Hemoglobin      11.4 - 14.8 g/dL  16.1    Hematocrit      34.7 - 43.7 %  39.8    Platelet Count      142 - 346 x10 3/uL  393 (H)    RBC      3.90 - 5.10 x10 6/uL  4.66    MCV      78.0 - 96.0 fL  85.4    MCH      25.1 - 33.5 pg  26.8    MCHC      31.5 - 35.8 g/dL  09.6 (L)    RDW      11 - 15 %  13    MPV      8.9 - 12.5 fL  10.8    Instrument Absolute Neutrophil Count      1.10 - 6.33 x10 3/uL  3.91    Neutrophils      None %  63.4    Lymphocytes Automated      None %  28.4    Monocytes      None %  5.0    Eosinophils Automated      None %  2.4    Basophils Automated  None %  0.5    Immature Granulocytes      None %  0.3    Nucleated RBC      0.0 - 0.0 /100 WBC  0.0    Neutrophils Absolute      1.10 - 6.33 x10 3/uL  3.91    Lymphocytes Absolute Automated      0.42 - 3.22 x10 3/uL  1.75    Monocytes Absolute Automated      0.21 - 0.85 x10 3/uL  0.31    Eosinophils Absolute Automated      0.00 - 0.44 x10 3/uL  0.15    Basophils Absolute Automated      0.00 - 0.08 x10 3/uL  0.03    Immature Granulocytes Absolute      0.00 - 0.07 x10 3/uL  0.02    Nucleated RBC Absolute      0.00 - 0.00 x10 3/uL  0.00    Sjogrens SSA (Ro) Antibody      0 - 19 U/mL 3     Sjogrens SSA (RO) Antibody Interpretation       Negative     Sjogrens SSB (La) Antibody      0 - 19 U/mL 3     Sjogrens SSB (La) Antibody Interpretation       Negative     Smith (Sm) Antibody      0 - 19  U/mL 5     Smith (Sm) Antibody Interpretation       Negative     Ribonucleoprotein (RNP) Antibody      0 - 19 U/mL 4     Ribonucleoprotein (RNP) Antibody Interpretation       Negative     Scleroderma (Scl-70) Antibody      0 - 19 U/mL 4     Scleroderma (Scl-70) Antibody Interpretation       Negative     Jo-1 Antibody      0 - 19 U/mL 3     JO-1 Antibody Interpretation       Negative     Double Stranded DNA(dsDNA)Antibody      0 - 200 U/mL 50     Double Stranded DNA(dsDNA)Antibody Interpretation       Negative     Centromere Antibody      0 - 19 U/mL 6     Centromere Antibody Interpretation       Negative     Histone Antibody      0.0 - 0.9 U/mL 0.5     Histone Antibody Interpretation       Negative     Chromatin Antibody      0 - 19 U/mL 7     Chromatin Antibody Interp       Negative     Ribosomal P Antibody      0 - 19 U/mL 3     Ribosomal P Antibody Interpretation       Negative     RNA Polymerase III Antibody      0 - 19 U/mL 6     RNA Polymerase III Antibody Interpretation       Negative     Urine Type        Clean Catch    Color, UA        Colorless    Clarity, UA      Clear - Hazy  Clear    Specific Gravity, UA      1.001 - 1.035  1.009    Urine pH      5.0 - 8.0  6.0    Leukocyte Esterase, UA      Negative  Negative    Nitrite, UA      Negative  Negative    Protein, UR      Negative  Negative    Glucose, UA      Negative  Negative    Ketones UA      Negative  Negative    Urobilinogen, UA      mg/dL  Normal    Bilirubin, UA      Negative  Negative    Blood, UA      Negative  Negative    RBC UA      /hpf  0-2    WBC, UA      0 - 5 /hpf  0-5    Squamous Epithelial Cells, Urine      0 - 25 /hpf  0-5    Urine Mucus      None  Present    ANA Screen      Negative Positive (A)     ANA Titer       1:160 (A)     ANA Pattern       Speckled (A)     Pathology Review-ANA       See Note     Sed Rate      0 - 20 mm/Hr   6   C-Reactive Protein      0.0 - 1.1 mg/dL   0.1   Rheumatoid Factor      0.0 - 30.0 IU/mL   <13.0    Creatine Kinase (CK)      29 - 233 U/L   118       Radiology:      US Pelvis Complete [[id number]] (Order [id number])  Status: Final result     Study Result    Narrative & Impression   Indication: Dysmenorrhea and menorrhagia. Anemia.        No prior study for comparison.     TECHNIQUE: Transabdominal imaging.. Transvaginal imaging was not  clinically appropriate.     FINDINGS: The uterus is normal in size and measures 6.7 x 2.9 x 4.4 cm  for total volume of 44 cc's. Endometrial stripe is normal and measures 5  mm.  Right ovary is normal and measures 2.1 x 1.8 x 1.8 cm. Follicular cysts.  Dominant follicle measures 11 mm. Left ovary is normal and measures 2.6  x 1.5 x 1.2 cm and contains small immature follicles.  No free fluid.     IMPRESSION:    Normal study          ASSESSMENT:   Sarah Ball is a 26 y.o. female.       She has fatigue and has intermittent rash/swollen lymph nodes. She has borderline +ANA. Negative ANA panel. Less likely she has active SLE.   She has intermittent numbness of tongue and dizziness/lightheadedness.   She is going to travel in [place].     PLAN:   1. Will monitor   2. Follow neurology  3. Continue advil as needed  4. Continue exercise   5. Follow as needed

## 2021-11-23 ENCOUNTER — Ambulatory Visit: Payer: BC Managed Care – PPO | Attending: Neurology | Admitting: Neurology

## 2021-11-23 ENCOUNTER — Encounter: Payer: Self-pay | Admitting: Neurology

## 2021-11-23 VITALS — BP 126/79 | HR 99

## 2021-11-23 DIAGNOSIS — R200 Anesthesia of skin: Secondary | ICD-10-CM

## 2021-11-23 DIAGNOSIS — F419 Anxiety disorder, unspecified: Secondary | ICD-10-CM

## 2021-11-23 DIAGNOSIS — G43009 Migraine without aura, not intractable, without status migrainosus: Secondary | ICD-10-CM | POA: Insufficient documentation

## 2021-11-23 DIAGNOSIS — R42 Dizziness and giddiness: Secondary | ICD-10-CM

## 2021-11-23 MED ORDER — RIBOFLAVIN 400 MG PO CAPS
400.0000 mg | ORAL_CAPSULE | Freq: Every day | ORAL | 6 refills | Status: AC
Start: 2021-11-23 — End: ?

## 2021-11-23 MED ORDER — SUMATRIPTAN SUCCINATE 50 MG PO TABS
50.0000 mg | ORAL_TABLET | ORAL | 3 refills | Status: DC | PRN
Start: 2021-11-23 — End: 2022-01-21

## 2021-11-23 NOTE — Progress Notes (Unsigned)
Subjective:           Patient ID: Sarah Ball is a 26 y.o. female here for Headache, Dizziness, and Numbness (lips)      HPI    26 y.o. female with PMH of anti-NMDA R encephalitis in 2014 here for further evaluation of Multiple sx including:    Headaches since Oct, may be improving over the last 1 mth.  Occurrs 2 times/week, lasting for few hours  biFrontal pain, Intensity 8/10  +photophobia, nausea, generalized weakness, lightheaded, tinnitis X2  Denies rhinorrhea, lacrimation, conj injection, sensory loss, diplopia, dysarthria  Claritin may have exacerbated lightheadedness, did better with children's claritin.  Ibuprofen with some relief.  Sleep: 4-5 hours/night last year, improved to 7 hours this year which may be correlated to improvement in headache  Mood: none  Caffeine: none    2. Tingling on tip of tongue and lower lip occurs daily for the last 1.5 mths, previously less frequent.  This can occur off and on throughout the day.  No other sensory loss throughout the body  Children's claritin may help.    3. Lightheadedness occurs few times/mth, can last for a few hours.  She may feel generalized weakness prior to sx onset with some ill feelings.   No CP, palpitations, SOB, diaphoresis, dizziness/vertigo, N/V.  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    Generalized weakness may be improved with better sleep and headache control.    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  Reports intermittent rash on chest/abdomen        Review of Systems All systems were reviewed and were negative except as described in the HPI.    Current/Home Medications    CETIRIZINE (ZYRTEC ALLERGY) 10 MG TABLET    Take 1 tablet (10 mg total) by mouth daily    CLOBETASOL (TEMOVATE) 0.05 % OINTMENT    Apply topically 2 (two) times daily    FERROUS SULFATE (IRON PO)    Take 125 mg by mouth daily Slow release    FLUTICASONE (FLONASE) 50 MCG/ACT NASAL SPRAY    1 spray by Nasal  route daily    LORATADINE (CLARITIN) 10 MG TABLET    Take 10 mg by mouth daily prn    NORETHIN ACE-ETH ESTRAD-FE (JUNEL FE 24) 1-20 MG-MCG(24) PER TABLET    Take 1 tablet by mouth daily    NYSTATIN (MYCOSTATIN) CREAM    Apply topically as needed (vulvar itching)    VITAMIN D PO    Take 2,000 IU by mouth every other day       The following portions of the patient's history were reviewed and updated as appropriate: allergies, current medications, past family history, past medical history, past social history, past surgical history and problem list.  No pertinent family history, except what is stated in HPI.          Objective:      Physical Exam Neurologic Exam    General: The patient was well developed and well nourished.  No acute distress.  Neck:  no carotid bruits  CVS: RRR, no murmurs, rubs,or gallops  Lungs: clear to auscultation bilaterally  Extremities: no pedal edema, extremities normal in color    Mental Status: The patient was awake, alert and oriented X4.  Normal affect.  Recent and remote memory appeared to be normal.   Attention span and concentration appear normal.  Fluent without aphasia.   Cranial nerves: Pupils are equal, round  and reactive to light.  Visual fields full.    EOM intact.  No ptosis.  No diplopia.  No nystagmus.  Facial sensation intact.   Face symmetric.  No dysarthria.  Hearing grossly intact.  Shoulder shrug symmetric.  Tongue protrudes midline.  Uvula is midline.  Motor: Muscle tone normal. No atrophy.  No fasiculations. No pronator drift.  Strength  R / L    R / L  Deltoid  5 / 5  Hip Flexion 5 / 5  Triceps  5 / 5   Hip extension 5 / 5  Biceps   5 / 5   Knee flexion 5 / 5  Wrist ext 5 / 5  Knee ext 5 / 5  Wrist flexion 5 / 5  Dorsiflexion 5 / 5  FF   5 / 5  Plantar flexion 5 / 5  Sensory:   Light touch intact.  Pinprick intact.  Temperature intact.  Vibration intact.  Proprioception intact.  Reflexes:  R / L     R / L  Biceps  2 / 2  Knees  2 / 2  Triceps 2 / 2  Ankles  2 /  2  Brachioradialis2 / 2  Babinksi Down / down  Coordination: FTN and HKS intact, no truncal ataxia. RAMs intact. No tremors  Gait: Stable. Good stride length, good initiation.  Intact to tandem, heel, toe walk. Negative Romberg.      Imaging/Testing:        Assessment:         26 y.o. female with PMH of anti-NMDA R encephalitis in 2014 here for further evaluation of Multiple sx including headaches, paresthesias, and lightheadedness.        Plan:        Migraines  - recommend MRI brain w/wo  - recommend Riboflavin supplementation for headache prevention, possible rash with Magnesium  - recommend Imitrex PRN migraines  -Patient was counseled to follow the SEEDS for success in headache management, including Sleep hygiene, Exercising regularly, Eating healthy and regular meals, Drinking water, keeping a headache Diary, and Stress reduction.    2. Perioral paresthesias, non-localizable  - will cont to monitor  - discuss anxiety as a contributing factor    3. Lightheadedness also may occur in the setting of anxiety  - pending MRI brain w/wo contrast  - consider cardiology evaluation      Charlynn Court, MD

## 2021-11-30 ENCOUNTER — Ambulatory Visit (INDEPENDENT_AMBULATORY_CARE_PROVIDER_SITE_OTHER): Payer: BC Managed Care – PPO | Admitting: Internal Medicine

## 2021-12-29 ENCOUNTER — Other Ambulatory Visit: Payer: Self-pay | Admitting: Neurology

## 2021-12-29 ENCOUNTER — Other Ambulatory Visit (HOSPITAL_BASED_OUTPATIENT_CLINIC_OR_DEPARTMENT_OTHER): Payer: Self-pay | Admitting: Nurse Practitioner

## 2022-01-14 ENCOUNTER — Ambulatory Visit (HOSPITAL_BASED_OUTPATIENT_CLINIC_OR_DEPARTMENT_OTHER): Payer: BC Managed Care – PPO | Admitting: Nurse Practitioner

## 2022-01-20 ENCOUNTER — Other Ambulatory Visit (HOSPITAL_BASED_OUTPATIENT_CLINIC_OR_DEPARTMENT_OTHER): Payer: Self-pay | Admitting: Nurse Practitioner

## 2022-01-21 ENCOUNTER — Ambulatory Visit (INDEPENDENT_AMBULATORY_CARE_PROVIDER_SITE_OTHER): Payer: BC Managed Care – PPO | Admitting: Nurse Practitioner

## 2022-01-21 ENCOUNTER — Encounter (HOSPITAL_BASED_OUTPATIENT_CLINIC_OR_DEPARTMENT_OTHER): Payer: Self-pay | Admitting: Nurse Practitioner

## 2022-01-21 VITALS — BP 129/77 | HR 116 | Temp 98.2°F | Ht 62.0 in | Wt 152.0 lb

## 2022-01-21 DIAGNOSIS — Z01411 Encounter for gynecological examination (general) (routine) with abnormal findings: Secondary | ICD-10-CM

## 2022-01-21 DIAGNOSIS — Z3041 Encounter for surveillance of contraceptive pills: Secondary | ICD-10-CM

## 2022-01-21 DIAGNOSIS — R6889 Other general symptoms and signs: Secondary | ICD-10-CM

## 2022-01-21 LAB — TSH: TSH: 0.9 u[IU]/mL (ref 0.35–4.94)

## 2022-01-21 LAB — T4, FREE: T4 Free: 1.02 ng/dL (ref 0.69–1.48)

## 2022-01-21 MED ORDER — HAILEY 24 FE 1-20 MG-MCG(24) PO TABS
1.0000 | ORAL_TABLET | Freq: Every day | ORAL | 3 refills | Status: DC
Start: 2022-01-21 — End: 2023-01-20

## 2022-01-21 NOTE — Progress Notes (Signed)
CC/HPI    Sarah Ball is a 26 y.o. female, established patient, here today for annual gyn exam.    C/o episodes of feeling hot (not sweating) and weak for the past 3 months.  Had tunnel vision with it once.  Has also noticed that cramp have returned, but taking an Advil helps.  Menses also start later in her placebo pills.    GYN Hx   Sexually Active: not currently  Birth Control: Hailey Fe 1/20  Menses: 13/4-5/monthly    Last pap: 12/2020, negative  Hx abnormal paps: denies  Colposcopy:  N/A  Hx STDs:  denies    OB Hx  [id number]    Allergies  Allergies   Allergen Reactions    Azithromycin Anaphylaxis    Phenytoin Anaphylaxis    Azo [Phenazopyridine]      rash    Magnesium Rash    Other Nausea And Vomiting and Headaches     Red Dye    Red Dye Nausea And Vomiting and Other (See Comments)       Medications  Current Outpatient Medications   Medication Sig Dispense Refill    clobetasol (TEMOVATE) 0.05 % ointment Apply topically 2 (two) times daily 30 g 0    Ferrous Sulfate (IRON PO) Take 125 mg by mouth daily Slow release      fluticasone (FLONASE) 50 MCG/ACT nasal spray 1 spray by Nasal route daily      loratadine (CLARITIN) 10 MG tablet Take 1 tablet (10 mg) by mouth daily prn      norethin ace-eth estrad-FE (Hailey 24 Fe) 1-20 MG-MCG(24) per tablet Take 1 tablet by mouth daily 84 tablet 3    nystatin (MYCOSTATIN) cream Apply topically as needed (vulvar itching) 30 g 1    Riboflavin 400 MG Cap Take 1 capsule (400 mg) by mouth daily 30 capsule 6    VITAMIN D PO Take 2,000 IU by mouth every other day       No current facility-administered medications for this visit.       PMH  Past Medical History:   Diagnosis Date    Anti-NMDA receptor encephalitis 2014           PSH  Past Surgical History:   Procedure Laterality Date    TONSILLECTOMY         SH  Social History     Socioeconomic History    Marital status: Single   Tobacco Use    Smoking status: Never     Passive exposure: Never    Smokeless tobacco: Never   Vaping  Use    Vaping Use: Never used   Substance and Sexual Activity    Alcohol use: Not Currently     Comment: socially    Drug use: Never    Sexual activity: Not Currently     Birth control/protection: OCP     Social Determinants of Health     Financial Resource Strain: Low Risk  (11/22/2021)    Overall Financial Resource Strain (CARDIA)     Difficulty of Paying Living Expenses: Not hard at all   Food Insecurity: No Food Insecurity (11/22/2021)    Hunger Vital Sign     Worried About Running Out of Food in the Last Year: Never true     Ran Out of Food in the Last Year: Never true   Transportation Needs: No Transportation Needs (11/22/2021)    PRAPARE - Therapist, art (Medical): No  Lack of Transportation (Non-Medical): No   Physical Activity: Sufficiently Active (11/22/2021)    Exercise Vital Sign     Days of Exercise per Week: 4 days     Minutes of Exercise per Session: 40 min   Stress: No Stress Concern Present (11/22/2021)    [employer] of Occupational Health - Occupational Stress Questionnaire     Feeling of Stress : Only a little   Social Connections: Moderately Integrated (11/22/2021)    Social Connection and Isolation Panel [NHANES]     Frequency of Communication with Friends and Family: More than three times a week     Frequency of Social Gatherings with Friends and Family: Twice a week     Attends Religious Services: More than 4 times per year     Active Member of [employer] or Organizations: Yes     Attends Banker Meetings: More than 4 times per year     Marital Status: Never married   Intimate Partner Violence: Not At Risk (11/22/2021)    Humiliation, Afraid, Rape, and Kick questionnaire     Fear of Current or Ex-Partner: No     Emotionally Abused: No     Physically Abused: No     Sexually Abused: No   Housing Stability: Low Risk  (11/22/2021)    Housing Stability Vital Sign     Unable to Pay for Housing in the Last Year: No     Number of Places Lived in the Last Year: 1      Unstable Housing in the Last Year: No       FH  Family History   Problem Relation Age of Onset    Hypertension Mother     Cancer Maternal Grandmother         breast ca    Hypertension Maternal Grandmother     Breast cancer Maternal Grandmother 33    Cancer Maternal Grandfather         prostate    Hypertension Maternal Grandfather     No known problems Father     Colon cancer Neg Hx     Diabetes Neg Hx     Eclampsia Neg Hx     Miscarriages / Stillbirths Neg Hx     Ovarian cancer Neg Hx     Preterm labor Neg Hx     Stroke Neg Hx        VITAL SIGNS  BP 129/77   Pulse (!) 116   Temp 98.2 F (36.8 C)   Ht 5\' 2"  (1.575 m)   Wt 152 lb (68.9 kg)   LMP 01/07/2022   BMI 27.80 kg/m     EXAM  GENERAL APPEARANCE    Alert, Well appearing, Oriented x 3, In no distress    BREAST  Sexual Maturity:  Tanner 5  Nipples:  Left - normal, no discharge; Right - normal, no discharge  Breast:  symmetrical; Left - normal, non-tender, no mass; Right - normal, non-tender, no mass    PELVIC  Vulva:  normal, no lesions, normal clitorus  Perineum:  intact  Introitus:  normal, no lesions, no discharge  Urethra:  normal  Vagina:  normal, no lesions, no masses, no tenderness  Cervix:  normal, no discharge, no lesions, non-friable  Uterus:  normal, not enlarged, no masses  Adnexa:  Left - normal, non-tender, not enlarged; Right - normal, non-tender, not enlarged  Rectal:  deferred    ASSESSMENT  1. Encounter for gynecological examination (general) (routine) with abnormal  findings    2. Surveillance for birth control, oral contraceptives    3. Heat intolerance        PLAN  Orders Placed This Encounter   Procedures    TSH    T4, free     Orders Placed This Encounter   Medications    norethin ace-eth estrad-FE (Hailey 24 Fe) 1-20 MG-MCG(24) per tablet     Sig: Take 1 tablet by mouth daily     Dispense:  84 tablet     Refill:  3        Pt aware normal results will be sent via My Chart.  Reviewed pap guidelines.  Discussed that episodes are not  likely related to OCPs.  Check TSH today.  Will f/u with PCP either way.  Reassured regarding menses starting later during placebo pills.    Maribel Luis L. Amazin Pincock, MS, RN, WHNP-BC

## 2022-02-03 ENCOUNTER — Ambulatory Visit (INDEPENDENT_AMBULATORY_CARE_PROVIDER_SITE_OTHER): Payer: BC Managed Care – PPO

## 2022-02-03 ENCOUNTER — Encounter (INDEPENDENT_AMBULATORY_CARE_PROVIDER_SITE_OTHER): Payer: Self-pay

## 2022-02-03 VITALS — BP 130/82 | HR 93 | Temp 98.2°F | Resp 20 | Ht 62.0 in | Wt 143.0 lb

## 2022-02-03 DIAGNOSIS — L272 Dermatitis due to ingested food: Secondary | ICD-10-CM

## 2022-02-03 DIAGNOSIS — R21 Rash and other nonspecific skin eruption: Secondary | ICD-10-CM

## 2022-02-03 MED ORDER — METHYLPREDNISOLONE 4 MG PO TBPK
ORAL_TABLET | ORAL | 0 refills | Status: AC
Start: 2022-02-03 — End: 2022-02-09

## 2022-02-03 MED ORDER — EPINEPHRINE 0.3 MG/0.3ML IJ SOAJ: 0.3000 mg | Freq: Once | INTRAMUSCULAR | 0 refills | Status: DC

## 2022-02-03 MED ORDER — CETIRIZINE HCL 10 MG PO TABS
10.0000 mg | ORAL_TABLET | Freq: Every day | ORAL | 0 refills | Status: DC
Start: 2022-02-03 — End: 2022-02-26

## 2022-02-03 NOTE — Progress Notes (Signed)
Date of Exam: 02/03/2022 3:03 PM        Patient ID: Sarah Ball is a 26 y.o. female.  Provider: Quay Burow, DNP FNP        Chief Complaint:    Chief Complaint   Patient presents with    Rash     All over body. ??allergic reaction (pineapple)               HPI:    HPI  26 y.o. female presents reporting rash on face, arms, legs, and feet since yesterday morning. Reports she took Claritin yesterday and it resolved the rash then later that night ate pineapple which caused rash to come back. States after consuming pineapple she immediately developed the rash and lip swelling. Reports she took Benadryl last night that resolved the rash and lip swelling.Today she feels better where rash is resolved however skin feels itchy.   Denies CP, SOB, wheezing, stridor, skin drainage, bleeding, or discharge.          Problem List:    Patient Active Problem List   Diagnosis    Seasonal allergies    History of palpitations    High risk medication use    Positive ANA (antinuclear antibody)    Chronic fatigue             Current Meds:    Outpatient Medications Marked as Taking for the 02/03/22 encounter (Office Visit) with Quay Burow, DNP FNP   Medication Sig Dispense Refill    clobetasol (TEMOVATE) 0.05 % ointment Apply topically 2 (two) times daily 30 g 0    Ferrous Sulfate (IRON PO) Take 125 mg by mouth daily Slow release      fluticasone (FLONASE) 50 MCG/ACT nasal spray 1 spray by Nasal route daily      hydrocortisone 0.5 % cream Apply 28 g topically 2 (two) times daily as needed      norethin ace-eth estrad-FE (Hailey 24 Fe) 1-20 MG-MCG(24) per tablet Take 1 tablet by mouth daily 84 tablet 3    nystatin (MYCOSTATIN) cream Apply topically as needed (vulvar itching) 30 g 1    Riboflavin 400 MG Cap Take 1 capsule (400 mg) by mouth daily 30 capsule 6    VITAMIN D PO Take 2,000 IU by mouth every other day      [DISCONTINUED] loratadine (CLARITIN) 10 MG tablet Take 1 tablet (10 mg) by mouth daily prn             Allergies:    Allergies   Allergen Reactions    Azithromycin Anaphylaxis    Phenytoin Anaphylaxis    Azo [Phenazopyridine]      rash    Magnesium Rash    Other Nausea And Vomiting and Headaches     Red Dye    Red Dye Nausea And Vomiting and Other (See Comments)             Past Surgical History:    Past Surgical History:   Procedure Laterality Date    TONSILLECTOMY             Family History:    Family History   Problem Relation Age of Onset    Hypertension Mother     Cancer Maternal Grandmother         breast ca    Hypertension Maternal Grandmother     Breast cancer Maternal Grandmother 33    Cancer Maternal Grandfather         prostate  Hypertension Maternal Grandfather     No known problems Father     Colon cancer Neg Hx     Diabetes Neg Hx     Eclampsia Neg Hx     Miscarriages / Stillbirths Neg Hx     Ovarian cancer Neg Hx     Preterm labor Neg Hx     Stroke Neg Hx            Social History:    Social History     Tobacco Use    Smoking status: Never     Passive exposure: Never    Smokeless tobacco: Never   Vaping Use    Vaping Use: Never used   Substance Use Topics    Alcohol use: Not Currently     Comment: socially    Drug use: Never           The following sections were reviewed this encounter by the provider:   Tobacco  Allergies  Meds  Problems  Med Hx  Surg Hx  Fam Hx             Vital Signs:    BP 130/82 (BP Site: Right arm, Patient Position: Sitting, Cuff Size: Medium)   Pulse 93   Temp 98.2 F (36.8 C) (Temporal)   Resp 20   Ht 1.575 m (5\' 2" )   Wt 64.9 kg (143 lb)   LMP 01/07/2022   SpO2 98%   BMI 26.16 kg/m          ROS:    Review of Systems   Constitutional: Negative.    HENT:  Negative for facial swelling, sore throat and trouble swallowing.    Eyes:  Negative for visual disturbance.   Respiratory:  Negative for chest tightness, shortness of breath, wheezing and stridor.    Cardiovascular:  Negative for chest pain and palpitations.   Gastrointestinal:  Negative for diarrhea, nausea  and vomiting.   Genitourinary:  Negative for hematuria.   Musculoskeletal:  Negative for gait problem and joint swelling.   Skin:  Positive for rash.   Neurological:  Negative for dizziness, weakness, light-headedness, numbness and headaches.                Physical Exam:    Physical Exam  Constitutional:       General: She is not in acute distress.     Appearance: Normal appearance. She is not ill-appearing, toxic-appearing or diaphoretic.   HENT:      Head: Normocephalic and atraumatic.      Mouth/Throat:      Lips: No lesions.      Mouth: No oral lesions.      Dentition: No gum lesions.      Tongue: No lesions.      Palate: No lesions.      Pharynx: Oropharynx is clear. Uvula midline. No pharyngeal swelling, oropharyngeal exudate or posterior oropharyngeal erythema.      Tonsils: No tonsillar abscesses.   Eyes:      Extraocular Movements: Extraocular movements intact.      Conjunctiva/sclera: Conjunctivae normal.   Cardiovascular:      Rate and Rhythm: Normal rate and regular rhythm.      Heart sounds: Normal heart sounds. No murmur heard.  Pulmonary:      Effort: Pulmonary effort is normal. No respiratory distress.      Breath sounds: Normal breath sounds. No stridor. No wheezing, rhonchi or rales.   Chest:      Chest  wall: No tenderness.   Musculoskeletal:         General: No swelling or tenderness. Normal range of motion.      Cervical back: Normal range of motion and neck supple. No rigidity or tenderness.   Skin:     General: Skin is warm.      Coloration: Skin is not jaundiced or pale.      Findings: No erythema or rash.   Neurological:      General: No focal deficit present.      Mental Status: She is alert.      Sensory: Sensation is intact.      Motor: Motor function is intact.      Coordination: Coordination is intact. Coordination normal.      Gait: Gait is intact. Gait normal.   Psychiatric:         Attention and Perception: Attention normal.         Mood and Affect: Mood and affect normal.          Speech: Speech normal.         Behavior: Behavior is cooperative.         Cognition and Memory: Cognition and memory normal.                Assessment/Plan:    1. Rash  - methylPREDNISolone (MEDROL DOSEPAK) 4 MG tablet; Follow Package Directions.  Dispense: 21 tablet; Refill: 0  - cetirizine (ZyrTEC Allergy) 10 MG tablet; Take 1 tablet (10 mg) by mouth daily  Dispense: 30 tablet; Refill: 0  - Take medications as prescribed; reviewed side effects  - Avoid triggers and follow up with the allergist for further evaluation and management    2. Food allergic skin reaction  - Ambulatory referral to Allergy; Future  - EPINEPHrine 0.3 MG/0.3ML Solution Auto-injector injection; Inject 0.3 mLs (0.3 mg) into the muscle once for 1 dose  Dispense: 0.3 mL; Refill: 0  - Take medications as prescribed; reviewed side effects  - Advised when and how to use the EpiPen; patient verbalized understanding  - Avoid triggers and follow up with the allergist for further evaluation and management  - ED precautions reviewed; patient verbalized understanding.      Reviewed treatment plan inclusive of risks and benefits. Patient agreeable with plan, all questions answered. ED precautions reviewed; patient verbalized understanding.            Follow-up:    Return if symptoms worsen or fail to improve.             Quay Burow, DNP FNP

## 2022-02-03 NOTE — Progress Notes (Signed)
Have you seen any specialists since your last visit with [place]?  No      The patient was informed that the following HM items are still outstanding:   Tetanus and Hep C screening          Pt stated took benadryl starting yesterday for rash /itching breakout.

## 2022-02-08 ENCOUNTER — Encounter (INDEPENDENT_AMBULATORY_CARE_PROVIDER_SITE_OTHER): Payer: Self-pay

## 2022-02-08 ENCOUNTER — Telehealth (INDEPENDENT_AMBULATORY_CARE_PROVIDER_SITE_OTHER): Payer: Self-pay | Admitting: Internal Medicine

## 2022-02-08 NOTE — Telephone Encounter (Signed)
Pt called - stated per NP placed an open referral for allergy specialist and advised  pt some one will complete the referral.  Pt is requesting specialist  info.  Please  review  and assist.

## 2022-02-17 ENCOUNTER — Ambulatory Visit: Payer: BC Managed Care – PPO | Admitting: Neurology

## 2022-02-17 ENCOUNTER — Encounter: Payer: Self-pay | Admitting: Neurology

## 2022-02-18 ENCOUNTER — Ambulatory Visit: Payer: BC Managed Care – PPO | Attending: Neurology | Admitting: Neurology

## 2022-02-18 VITALS — Ht 62.0 in | Wt 137.0 lb

## 2022-02-18 DIAGNOSIS — G43009 Migraine without aura, not intractable, without status migrainosus: Secondary | ICD-10-CM

## 2022-02-18 DIAGNOSIS — F419 Anxiety disorder, unspecified: Secondary | ICD-10-CM

## 2022-02-18 DIAGNOSIS — R42 Dizziness and giddiness: Secondary | ICD-10-CM

## 2022-02-25 ENCOUNTER — Encounter: Payer: Self-pay | Admitting: Neurology

## 2022-02-25 ENCOUNTER — Other Ambulatory Visit (INDEPENDENT_AMBULATORY_CARE_PROVIDER_SITE_OTHER): Payer: Self-pay

## 2022-02-25 DIAGNOSIS — R21 Rash and other nonspecific skin eruption: Secondary | ICD-10-CM

## 2022-02-25 NOTE — Progress Notes (Signed)
Subjective:           Patient ID: Sarah Ball is a 26 y.o. female here for Migraine      HPI    26 y.o. female with PMH of anti-NMDA R encephalitis in 2014 here for further evaluation of Multiple sx including:    Headaches have improved, only once/week.    Headaches since Oct, may be improving over the last 1 mth.  Occurrs 2 times/week, lasting for few hours  biFrontal pain, Intensity 8/10  +photophobia, nausea, generalized weakness, lightheaded, tinnitis X2  Denies rhinorrhea, lacrimation, conj injection, sensory loss, diplopia, dysarthria  Claritin may have exacerbated lightheadedness, did better with children's claritin.  Ibuprofen with some relief.  Sleep: 4-5 hours/night last year, improved to 7 hours this year which may be correlated to improvement in headache  Mood: none  Caffeine: none    2. Tingling on tip of tongue and lower lip occurs daily for the last 1.5 mths, previously less frequent.  This can occur off and on throughout the day.  No other sensory loss throughout the body  Children's claritin may help.  --> improved    3. Lightheadedness occurs few times/mth, can last for a few hours.  She may feel generalized weakness prior to sx onset with some ill feelings.   No CP, palpitations, SOB, diaphoresis, dizziness/vertigo, N/V.  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    Generalized weakness may be improved with better sleep and headache control.    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  Reports intermittent rash on chest/abdomen        Review of Systems All systems were reviewed and were negative except as described in the HPI.    Current/Home Medications    CETIRIZINE (ZYRTEC ALLERGY) 10 MG TABLET    Take 1 tablet (10 mg) by mouth daily    CLOBETASOL (TEMOVATE) 0.05 % OINTMENT    Apply topically 2 (two) times daily    EPINEPHRINE 0.3 MG/0.3ML SOLUTION AUTO-INJECTOR INJECTION    Inject 0.3 mLs (0.3 mg) into the muscle once for 1 dose     FERROUS SULFATE (IRON PO)    Take 125 mg by mouth daily Slow release    FLUTICASONE (FLONASE) 50 MCG/ACT NASAL SPRAY    1 spray by Nasal route daily    NORETHIN ACE-ETH ESTRAD-FE (HAILEY 24 FE) 1-20 MG-MCG(24) PER TABLET    Take 1 tablet by mouth daily    NYSTATIN (MYCOSTATIN) CREAM    Apply topically as needed (vulvar itching)    RIBOFLAVIN 400 MG CAP    Take 1 capsule (400 mg) by mouth daily    VITAMIN D PO    Take 2,000 IU by mouth every other day       The following portions of the patient's history were reviewed and updated as appropriate: allergies, current medications, past family history, past medical history, past social history, past surgical history and problem list.  No pertinent family history, except what is stated in HPI.          Objective:      Physical Exam Neurologic Exam    On exam, the patient's mental status appeared normal. Speech showed appropriate content, was fluent, and the patient followed commands. Attention and concentration appeared normal. Cranial nerves showed, full extraocular movements, midline tongue, and a symmetric face. Motor exam showed good movement in all extremities without obvious focality or weakness.      Imaging/Testing:  12/2021 MRI brain w/wo: 1.  Mild white matter changes in the frontal lobes are nonspecific but  can be seen in the setting of migraine headaches.  2.  Mild Chiari one malformation.      Assessment:         26 y.o. female with PMH of anti-NMDA R encephalitis in 2014 here for further evaluation of Multiple sx including headaches, paresthesias, and lightheadedness.    MRI with mild nonspecific WM lesions, like normal with her history.  There is a mild Chiari malformation which we an follow with serialimaging.          Plan:        Migraines  - MRI brain with chiari malformation  - cont Riboflavin supplementation for headache prevention, possible rash with Magnesium  - recommend Imitrex PRN migraines  -Patient was counseled to follow the SEEDS for  success in headache management, including Sleep hygiene, Exercising regularly, Eating healthy and regular meals, Drinking water, keeping a headache Diary, and Stress reduction.    2. Perioral paresthesias, non-localizable  - will cont to monitor  - discuss anxiety as a contributing factor    3. Lightheadedness also may occur in the setting of anxiety  - MRI brain w/wo contrast wnl  - consider cardiology evaluation    4. Chiari malformation  - will recommend repeat MRI in 1 year      Charlynn Court, MD

## 2022-03-15 ENCOUNTER — Encounter (INDEPENDENT_AMBULATORY_CARE_PROVIDER_SITE_OTHER): Payer: Self-pay | Admitting: Internal Medicine

## 2022-03-15 ENCOUNTER — Ambulatory Visit (INDEPENDENT_AMBULATORY_CARE_PROVIDER_SITE_OTHER): Payer: BC Managed Care – PPO | Admitting: Internal Medicine

## 2022-03-15 VITALS — BP 116/79 | HR 94 | Temp 99.4°F | Resp 16 | Ht 62.0 in | Wt 147.0 lb

## 2022-03-15 DIAGNOSIS — Z0000 Encounter for general adult medical examination without abnormal findings: Secondary | ICD-10-CM

## 2022-03-15 DIAGNOSIS — R002 Palpitations: Secondary | ICD-10-CM

## 2022-03-15 NOTE — Progress Notes (Signed)
Have you seen any specialists/other providers since your last visit with [place]?    Yes. Allergist, Neuro    Health Maintenance Due   Topic Date Due    Tetanus Ten-Year  Never done    HEPATITIS C SCREENING  Never done    INFLUENZA VACCINE  Never done

## 2022-03-15 NOTE — Progress Notes (Signed)
Subjective:      Date: 03/15/2022 9:04 AM   Patient ID: Sarah Ball is a 26 y.o. female.    Chief Complaint:  Chief Complaint   Patient presents with    Annual Exam     Pt is present for a physical. Pt is fasting.       HPI  Visit Type: Health Maintenance Visit  Work Status: working full-time  Reported Health: good health   Reported Diet:  vegetarian x5-14yrs  Reported Exercise:  4x/week  Dental: regular dental visits twice a year  Vision: glasses, contact lenses and regular eye exams   Hearing: normal hearing  Immunization Status: immunizations up to date except for the TD which she will check on and inform our office  Reproductive Health: not currently sexually active and premenopausal  Prior Screening Tests: last pap smear in 2022  General Health Risks: family history of prostate cancer, no family history of colon cancer and family history of breast cancer  Safety Elements Used: uses seat belts and smoke detectors in household  States has noted her hrt rate goes high and sometimes low on the fit bit and reports chest pressure intermittently l when that happens. No exertional CP  Admits to increased stress with work and recent passing of her grand father  Problem List:  Patient Active Problem List   Diagnosis    Seasonal allergies    History of palpitations    High risk medication use    Positive ANA (antinuclear antibody)    Chronic fatigue       Current Medications:  Current Outpatient Medications   Medication Sig Dispense Refill    cetirizine (ZyrTEC) 10 MG tablet TAKE 1 TABLET (10 MG) BY MOUTH DAILY. 90 tablet 0    clobetasol (TEMOVATE) 0.05 % ointment Apply topically 2 (two) times daily 30 g 0    Ferrous Sulfate (IRON SLOW RELEASE PO) 1 tablet every day by oral route.      fluticasone (FLONASE) 50 MCG/ACT nasal spray 1 spray by Nasal route daily      norethin ace-eth estrad-FE (Hailey 24 Fe) 1-20 MG-MCG(24) per tablet Take 1 tablet by mouth daily 84 tablet 3    nystatin (MYCOSTATIN) cream Apply topically  as needed (vulvar itching) 30 g 1    Riboflavin 400 MG Cap Take 1 capsule (400 mg) by mouth daily 30 capsule 6    VITAMIN D PO Take 2,000 IU by mouth every other day      EPINEPHrine 0.3 MG/0.3ML Solution Auto-injector injection Inject 0.3 mLs (0.3 mg) into the muscle once for 1 dose 0.3 mL 0    Ferrous Sulfate (IRON PO) Take 125 mg by mouth daily Slow release (Patient not taking: Reported on 03/15/2022)       No current facility-administered medications for this visit.       Allergies:  Allergies   Allergen Reactions    Azithromycin Anaphylaxis    Phenytoin Anaphylaxis    Azo [Phenazopyridine]      rash    Magnesium Rash    Other Nausea And Vomiting and Headaches     Red Dye    Red Dye Nausea And Vomiting and Other (See Comments)       Past Medical History:  Past Medical History:   Diagnosis Date    Anti-NMDA receptor encephalitis 2014       Past Surgical History:  Past Surgical History:   Procedure Laterality Date    TONSILLECTOMY  Family History:  Family History   Problem Relation Age of Onset    Hypertension Mother     Cancer Maternal Grandmother         breast ca    Hypertension Maternal Grandmother     Breast cancer Maternal Grandmother 74    Cancer Maternal Grandfather         prostate    Hypertension Maternal Grandfather     No known problems Father     Colon cancer Neg Hx     Diabetes Neg Hx     Eclampsia Neg Hx     Miscarriages / Stillbirths Neg Hx     Ovarian cancer Neg Hx     Preterm labor Neg Hx     Stroke Neg Hx        Social History:  Social History     Socioeconomic History    Marital status: Single   Tobacco Use    Smoking status: Never     Passive exposure: Never    Smokeless tobacco: Never   Vaping Use    Vaping Use: Never used   Substance and Sexual Activity    Alcohol use: Not Currently     Comment: socially    Drug use: Never    Sexual activity: Not Currently     Birth control/protection: OCP     Social Determinants of Health     Financial Resource Strain: Low Risk  (11/22/2021)    Overall  Financial Resource Strain (CARDIA)     Difficulty of Paying Living Expenses: Not hard at all   Food Insecurity: No Food Insecurity (11/22/2021)    Hunger Vital Sign     Worried About Running Out of Food in the Last Year: Never true     Ran Out of Food in the Last Year: Never true   Transportation Needs: No Transportation Needs (11/22/2021)    PRAPARE - Therapist, art (Medical): No     Lack of Transportation (Non-Medical): No   Physical Activity: Sufficiently Active (11/22/2021)    Exercise Vital Sign     Days of Exercise per Week: 4 days     Minutes of Exercise per Session: 40 min   Stress: No Stress Concern Present (11/22/2021)    [employer] of Occupational Health - Occupational Stress Questionnaire     Feeling of Stress : Only a little   Social Connections: Moderately Integrated (11/22/2021)    Social Connection and Isolation Panel [NHANES]     Frequency of Communication with Friends and Family: More than three times a week     Frequency of Social Gatherings with Friends and Family: Twice a week     Attends Religious Services: More than 4 times per year     Active Member of [employer] or Organizations: Yes     Attends Banker Meetings: More than 4 times per year     Marital Status: Never married   Intimate Partner Violence: Not At Risk (11/22/2021)    Humiliation, Afraid, Rape, and Kick questionnaire     Fear of Current or Ex-Partner: No     Emotionally Abused: No     Physically Abused: No     Sexually Abused: No   Housing Stability: Low Risk  (11/22/2021)    Housing Stability Vital Sign     Unable to Pay for Housing in the Last Year: No     Number of Places Lived in the Last Year: 1  Unstable Housing in the Last Year: No        The following sections were reviewed this encounter by the provider:   Tobacco  Allergies  Meds  Problems  Med Hx  Surg Hx  Fam Hx         ROS:   General/Constitutional:   Denies Change in appetite. Denies Chills. Denies Fatigue. Denies Fever.    Ophthalmologic:   Denies Blurred vision. Denies Eye Discharge. Denies Eye Pain.   ENT:   Denies Nasal Discharge. Denies Hoarseness. Denies Ear pain. Denies Nosebleed. Denies Sinus pain. Denies Sore throat.   Endocrine:   Denies Polydipsia. Denies Polyuria. Denies Weakness.   Respiratory:   Denies Paroxysmal Nocturnal Dyspnea. Denies Cough. Denies Orthopnea. Denies Shortness of breath. Denies Wheezing.   Cardiovascular:   As above. Denies Chest pain with exertion. Denies Leg Claudication.. Denies Swelling in hands/feet.   Gastrointestinal:   Denies Abdominal pain. Denies Blood in stool. Denies Constipation. Denies Diarrhea. Denies Heartburn. Denies Nausea. Denies Vomiting.   Hematology:   Denies Easy bruising. Denies Easy Bleeding. Denies Swollen glands.   Genitourinary:   Denies Blood in urine. Denies Nocturia. Denies Painful urination.   Musculoskeletal:   Denies Joint pain. Denies Joint stiffness. Denies Leg cramps. Denies Muscle aches. Denies Weakness in LE. Denies Swollen joints. Denies Weakness in UE.   Skin:   Denies Itching.Denies Rash.   Neurologic:   Denies Balance difficulty. Denies Dizziness. Denies Gait abnormality. Denies Headache. Denies Pre-Syncope. Denies Memory loss. Denies Seizures. Denies Tingling/Numbness.   Psychiatric:   Denies Anxiety. Denies Depressed mood. Denies Difficulty sleeping.     Objective:     Vitals:  BP 116/79 (BP Site: Right arm, Patient Position: Sitting, Cuff Size: Medium)   Pulse 94   Temp 99.4 F (37.4 C) (Oral)   Resp 16   Ht 1.575 m (5\' 2" )   Wt 66.7 kg (147 lb)   LMP 02/23/2022 (Approximate)   SpO2 97%   BMI 26.89 kg/m     Examination:   General Examination:   GENERAL APPEARANCE: alert, in no acute distress, well developed, well nourished, oriented to time, place, and person.   HEAD: normal appearance, atraumatic.   EYES: extraocular movement intact (EOMI), pupils equal, round, reactive to light and accommodation, sclera anicteric, conjunctiva clear.   EARS:  tympanic membranes normal bilaterally, external canals normal .   NOSE: nares patent..   ORAL CAVITY: normal oropharynx, normal lips, mucosa moist, no lesions.   THROAT: normal appearance, clear, no erythema.   NECK/THYROID: neck supple, no carotid bruit, carotid pulse 2+ bilaterally, no cervical lymphadenopathy, no neck mass palpated, no jugular venous distention, no thyromegaly.   LYMPH NODES: no palpable adenopathy.   SKIN: good turgor, no rashes, no suspicious lesions.   HEART: S1, S2 normal, no murmurs, rubs, gallops, regular rate and rhythm.   LUNGS: normal effort / no distress, normal breath sounds, clear to auscultation bilaterally, no wheezes, rales, rhonchi.   ABDOMEN: bowel sounds present, no hepatosplenomegaly, soft, nontender, nondistended.   FEMALE GENITOURINARY: Deferred, up-to-date  MUSCULOSKELETAL: full range of motion, no swelling or deformity.   EXTREMITIES: no edema, no clubbing, cyanosis, or edema.   PERIPHERAL PULSES: 2+ dorsalis pedis, 2+ posterior tibial.   NEUROLOGIC: nonfocal, cranial nerves 2-12 grossly intact, deep tendon reflexes 2+ symmetrical, normal strength, tone and reflexes, sensory exam intact.   PSYCH: cognitive function intact, mood/affect full range, speech clear.     Assessment/Plan:       1. Well adult  exam  - Lipid panel; Future  - Comprehensive metabolic panel; Future  - CBC and differential; Future  - Hemoglobin A1C; Future  - Hepatitis C (HCV) antibody, Total; Future  - TSH, Abn Reflex to Free T4, Serum; Future    2. Palpitations  - ECG 12 lead  - Referral to Cardiology: [facility] (INTERNAL); Future        Health Maintenance:  Vision screening UTD. Dental Screening UTD. Cervical cancer screening is UTD.    Return in about 1 year (around 03/16/2023) for Annual Physical.    Kela Millin, MD

## 2022-03-22 ENCOUNTER — Other Ambulatory Visit (FREE_STANDING_LABORATORY_FACILITY): Payer: BC Managed Care – PPO

## 2022-03-22 DIAGNOSIS — Z0000 Encounter for general adult medical examination without abnormal findings: Secondary | ICD-10-CM

## 2022-03-22 LAB — LIPID PANEL
Cholesterol / HDL Ratio: 2.7 Index
Cholesterol: 167 mg/dL (ref 0–199)
HDL: 63 mg/dL (ref 40–9999)
LDL Calculated: 88 mg/dL (ref 0–99)
Triglycerides: 80 mg/dL (ref 34–149)
VLDL Calculated: 16 mg/dL (ref 10–40)

## 2022-03-22 LAB — COMPREHENSIVE METABOLIC PANEL
ALT: 12 U/L (ref 0–55)
AST (SGOT): 15 U/L (ref 5–41)
Albumin/Globulin Ratio: 1.5 (ref 0.9–2.2)
Albumin: 4.1 g/dL (ref 3.5–5.0)
Alkaline Phosphatase: 45 U/L (ref 37–117)
Anion Gap: 9 (ref 5.0–15.0)
BUN: 8 mg/dL (ref 7.0–21.0)
Bilirubin, Total: 1 mg/dL (ref 0.2–1.2)
CO2: 22 mEq/L (ref 17–29)
Calcium: 9.2 mg/dL (ref 8.5–10.5)
Chloride: 108 mEq/L (ref 99–111)
Creatinine: 0.8 mg/dL (ref 0.4–1.0)
Globulin: 2.7 g/dL (ref 2.0–3.6)
Glucose: 82 mg/dL (ref 70–100)
Potassium: 4.5 mEq/L (ref 3.5–5.3)
Protein, Total: 6.8 g/dL (ref 6.0–8.3)
Sodium: 139 mEq/L (ref 135–145)
eGFR: >60 mL/min/{1.73_m2} (ref >=60)

## 2022-03-22 LAB — CBC AND DIFFERENTIAL
Absolute NRBC: 0 10*3/uL (ref 0.00–0.00)
Basophils Absolute Automated: 0.02 10*3/uL (ref 0.00–0.08)
Basophils Automated: 0.4 %
Eosinophils Absolute Automated: 0.02 10*3/uL (ref 0.00–0.44)
Eosinophils Automated: 0.4 %
Hematocrit: 41.2 % (ref 34.7–43.7)
Hgb: 12.5 g/dL (ref 11.4–14.8)
Immature Granulocytes Absolute: 0.01 10*3/uL (ref 0.00–0.07)
Immature Granulocytes: 0.2 %
Instrument Absolute Neutrophil Count: 2.27 10*3/uL (ref 1.10–6.33)
Lymphocytes Absolute Automated: 2.03 10*3/uL (ref 0.42–3.22)
Lymphocytes Automated: 44.3 %
MCH: 26.3 pg (ref 25.1–33.5)
MCHC: 30.3 g/dL — ABNORMAL LOW (ref 31.5–35.8)
MCV: 86.6 fL (ref 78.0–96.0)
MPV: 10.9 fL (ref 8.9–12.5)
Monocytes Absolute Automated: 0.23 10*3/uL (ref 0.21–0.85)
Monocytes: 5 %
Neutrophils Absolute: 2.27 10*3/uL (ref 1.10–6.33)
Neutrophils: 49.7 %
Nucleated RBC: 0 /100 WBC (ref 0.0–0.0)
Platelets: 352 10*3/uL — ABNORMAL HIGH (ref 142–346)
RBC: 4.76 10*6/uL (ref 3.90–5.10)
RDW: 14 % (ref 11–15)
WBC: 4.58 10*3/uL (ref 3.10–9.50)

## 2022-03-22 LAB — HEMOLYSIS INDEX: Hemolysis Index: 13 Index (ref 0–24)

## 2022-03-22 LAB — THYROID STIMULATING HORMONE (TSH), REFLEX ON ABNORMAL TO FREE T4, SERUM: TSH, Abn Reflex to Free T4, Serum: 1.18 u[IU]/mL (ref 0.35–4.94)

## 2022-03-22 LAB — HEPATITIS C ANTIBODY: Hepatitis C, AB: NONREACTIVE

## 2022-03-22 LAB — HEMOGLOBIN A1C
Average Estimated Glucose: 105.4 mg/dL
Hemoglobin A1C: 5.3 % (ref 4.6–5.6)

## 2022-03-23 ENCOUNTER — Other Ambulatory Visit: Payer: BC Managed Care – PPO

## 2022-03-24 ENCOUNTER — Encounter (INDEPENDENT_AMBULATORY_CARE_PROVIDER_SITE_OTHER): Payer: Self-pay | Admitting: Internal Medicine

## 2022-03-28 NOTE — Progress Notes (Signed)
Spoke with patient and answered her questions.

## 2022-04-01 ENCOUNTER — Ambulatory Visit (INDEPENDENT_AMBULATORY_CARE_PROVIDER_SITE_OTHER): Payer: BC Managed Care – PPO | Admitting: Cardiovascular Disease

## 2022-04-15 ENCOUNTER — Ambulatory Visit (INDEPENDENT_AMBULATORY_CARE_PROVIDER_SITE_OTHER): Payer: BC Managed Care – PPO | Admitting: Cardiovascular Disease

## 2022-04-15 ENCOUNTER — Encounter (INDEPENDENT_AMBULATORY_CARE_PROVIDER_SITE_OTHER): Payer: Self-pay | Admitting: Cardiovascular Disease

## 2022-04-15 VITALS — BP 117/79 | HR 98 | Ht 62.0 in | Wt 148.0 lb

## 2022-04-15 DIAGNOSIS — R002 Palpitations: Secondary | ICD-10-CM

## 2022-04-15 DIAGNOSIS — Z87898 Personal history of other specified conditions: Secondary | ICD-10-CM

## 2022-04-15 NOTE — Progress Notes (Signed)
[facility] OFFICE CONSULTATION      Chief Complaint   Patient presents with    Establish Care    Palpitations    Dizziness    Fatigue       I had the pleasure of seeing Sarah Ball today for cardiovascular evaluation. She is a pleasant 26 y.o. female with no significant cardiovascular history, who presents for cardiac evaluation of palpitations.    She has noted intermittent palpitations since the end of last year. She has noted more frequent episodes. She can have associated chest tightness and notes HRs in the 110s on her Fitbit. She feels her heart beating faster and can also feel lightheaded. Symptoms can last for several minutes - 20-30 minutes. Episodes are occurring about 1-2x/week with no obvious triggers. She does not drink a lot of caffeine.   She does pilates with no significant CP or SOB. She sings and performs.         PAST MEDICAL HISTORY:   Past Medical History:   Diagnosis Date    Anti-NMDA receptor encephalitis 2014         MEDICATIONS:     Current Outpatient Medications   Medication Sig Dispense Refill    cetirizine (ZyrTEC) 10 MG tablet TAKE 1 TABLET (10 MG) BY MOUTH DAILY. 90 tablet 0    clobetasol (TEMOVATE) 0.05 % ointment Apply topically 2 (two) times daily 30 g 0    Ferrous Sulfate (IRON PO) Take 125 mg by mouth daily Slow release      fluticasone (FLONASE) 50 MCG/ACT nasal spray 1 spray by Nasal route daily      norethin ace-eth estrad-FE (Hailey 24 Fe) 1-20 MG-MCG(24) per tablet Take 1 tablet by mouth daily 84 tablet 3    nystatin (MYCOSTATIN) cream Apply topically as needed (vulvar itching) 30 g 1    Riboflavin 400 MG Cap Take 1 capsule (400 mg) by mouth daily 30 capsule 6    VITAMIN D PO Take 2,000 IU by mouth every other day      EPINEPHrine 0.3 MG/0.3ML Solution Auto-injector injection Inject 0.3 mLs (0.3 mg) into the muscle once for 1 dose (Patient not taking: Reported on 04/15/2022) 0.3 mL 0     No current facility-administered medications for this visit.            ALLERGIES:   Allergies   Allergen Reactions    Azithromycin Anaphylaxis    Phenytoin Anaphylaxis    Azo [Phenazopyridine]      rash    Magnesium Rash    Other Nausea And Vomiting and Headaches     Red Dye    Red Dye Nausea And Vomiting and Other (See Comments)         FAMILY HISTORY: Her family history includes Breast cancer (age of onset: 54) in her maternal grandmother; Cancer in her maternal grandfather and maternal grandmother; Heart failure in her maternal grandmother; Hypertension in her maternal grandfather, maternal grandmother, and mother; No known problems in her father.  No premature CAD.      SOCIAL HISTORY: She reports that she has never smoked. She has never been exposed to tobacco smoke. She has never used smokeless tobacco. She reports that she does not currently use alcohol. She reports that she does not use drugs.      REVIEW OF SYSTEMS: All other systems reviewed and negative except as above.         PHYSICAL EXAMINATION  Vital Signs: BP 117/79 (BP Site: Left arm,  Patient Position: Sitting, Cuff Size: Medium)   Pulse 98   Ht 1.575 m (5\' 2" )   Wt 67.1 kg (148 lb)   SpO2 100%   BMI 27.07 kg/m    Vital signs reviewed    Wt Readings from Last 3 Encounters:   04/15/22 67.1 kg (148 lb)   03/15/22 66.7 kg (147 lb)   02/18/22 62.1 kg (137 lb)        General Appearance:  A well-appearing female in no acute distress.    HEENT: Sclera anicteric, conjunctiva without pallor, moist mucous membranes, normal dentition.   Neck:  Supple without jugular venous distention.  Normal carotid upstrokes without bruits.   Chest: Clear to auscultation bilaterally with good air movement and respiratory effort and no wheezes, rales, or rhonchi   Cardiac: RRR.  Normal S1 and physiologically split S2, without gallops or rub. 1/6 SEM   Vascular:  2+ carotid, radial pulses bilaterally  Abdomen: Soft, nontender, nondistended, with normoactive bowel sounds.  No bruits.   Extremities: Warm without edema, clubbing, or  cyanosis.   Skin: No rash, warm, appropriate for race.   Neuro: Alert and oriented x3. Grossly intact.  CN II-XII intact.  Normal mood and affect.       ECG:    Independent review shows:      ASSESSMENT/PLAN:    Palpitations. 2 week monitor to assess for arrhythmia.   Murmur. 1/6 flow murmur on exam. Will order echo.       All patient's questions and concerns regarding cardiovascular disease were answered during this visit.    Orders Placed This Encounter   Procedures    ([facility]) Mobile Cardiac Telemetry    Transthoracic Echocardiogram (TTE)       Return in about 4 weeks (around 05/13/2022).    Carolynne Edouard, MD  04/15/2022

## 2022-04-25 ENCOUNTER — Ambulatory Visit (INDEPENDENT_AMBULATORY_CARE_PROVIDER_SITE_OTHER): Payer: BC Managed Care – PPO

## 2022-04-25 ENCOUNTER — Encounter (INDEPENDENT_AMBULATORY_CARE_PROVIDER_SITE_OTHER): Payer: Self-pay

## 2022-04-25 DIAGNOSIS — R002 Palpitations: Secondary | ICD-10-CM

## 2022-04-25 LAB — ECHO ADULT TTE COMPLETE
AV Area (Cont Eq VTI): 1.89
AV Area (Cont Eq VTI): 1.897
AV Mean Gradient: 4
AV Peak Velocity: 136
Ao Root Diameter (2D): 2.4
BP Mod LV Ejection Fraction: 61.9
IVS Diastolic Thickness (2D): 0.68
LA Dimension (2D): 3.1
LA Volume Index (BP A-L): 0.029
LVID diastole (2D): 4.96
LVID systole (2D): 2.91
MV Area (PHT): 4.683
MV E/A: 2
MV E/A: 2.033
MV E/e' (Average): 6.308
Mitral Valve Findings: NORMAL
Prox Ascending Aorta Diameter: 2.1
Pulmonary Valve Findings: NORMAL
RV Basal Diastolic Dimension: 3.29
RV Function: NORMAL
Site RA Size (AS): NORMAL
Site RV Size (AS): NORMAL
TAPSE: 1.97
TAPSE: 2.01
Tricuspid Valve Findings: NORMAL

## 2022-05-05 ENCOUNTER — Encounter (INDEPENDENT_AMBULATORY_CARE_PROVIDER_SITE_OTHER): Payer: Self-pay

## 2022-05-05 NOTE — Progress Notes (Signed)
[facility] - [facility]      Cassandra Harbold         [id number], female, 06/29/1996    Ordering Physician:  Carolynne Edouard, MD  Primary Physician:  Kela Millin, MD  Primary Cardiologist:  Carolynne Edouard, MD    Indication:  Palpitations     Data     Hook up date:  04/15/2022        Disconnect date:  04/29/2022  Total days of monitoring:  14  Resulted date:  05/05/2022  Quality:  fair   Tech comments:      Findings     Baseline rhythm:  Sinus rhythm  Arrhythmia:  rare PACs    Symptoms reported:  dizziness, chest pain, palpitations  Symptom correlation with arrhythmia:  inconsistent    Impression     Rare PACs with a burden of 0.22%.      Interpreted and electronically signed by:  Carolynne Edouard, MD, [facility]

## 2022-05-18 ENCOUNTER — Ambulatory Visit (INDEPENDENT_AMBULATORY_CARE_PROVIDER_SITE_OTHER): Payer: BC Managed Care – PPO | Admitting: Registered Nurse

## 2022-05-18 ENCOUNTER — Encounter (INDEPENDENT_AMBULATORY_CARE_PROVIDER_SITE_OTHER): Payer: Self-pay | Admitting: Registered Nurse

## 2022-05-18 VITALS — BP 124/79 | HR 89 | Ht 62.0 in | Wt 148.8 lb

## 2022-05-18 DIAGNOSIS — R002 Palpitations: Secondary | ICD-10-CM

## 2022-05-18 DIAGNOSIS — Z712 Person consulting for explanation of examination or test findings: Secondary | ICD-10-CM

## 2022-05-18 NOTE — Progress Notes (Signed)
[facility] OFFICE VISIT    ASSESSMENT & PLAN:   1. Palpitations        2. Encounter to discuss test results           *Palpitations. 2 week monitor did not show any arrhythmia associated with symptoms    *Murmur. 1/6 flow murmur on exam. Will order echo.     Return if symptoms worsen or fail to improve.    HISTORY OF PRESENT ILLNESS:  I had the pleasure of seeing Sarah Ball today in a cardiac follow up office visit for palpitations and review of test results.    Sarah Ball is a 26 y.o. female with a past medical history significant for palpitations.    At today's visit Sarah Ball is doing well.  She denies chest pain, shortness of breath, edema, palpitations, presyncope, or syncope.       Cardiographics:  TTE(04/25/2022):  LVEF 62 %, no significant valve dysfunction    Data      Hook up date:  04/15/2022                                                                   Disconnect date:  04/29/2022  Total days of monitoring:  14  Resulted date:  05/05/2022  Quality:  fair   Tech comments:       Findings      Baseline rhythm:  Sinus rhythm  Arrhythmia:  rare PACs     Symptoms reported:  dizziness, chest pain, palpitations  Symptom correlation with arrhythmia:  inconsistent     Impression      Rare PACs with a burden of 0.22%.      PMH:   Patient Active Problem List    Diagnosis Date Noted    High risk medication use 08/20/2021    Positive ANA (antinuclear antibody) 08/20/2021    Chronic fatigue 08/20/2021    Seasonal allergies 12/10/2019    History of palpitations 12/10/2019        MEDICATIONS:     Current Outpatient Medications   Medication Sig Dispense Refill    cetirizine (ZyrTEC) 10 MG tablet TAKE 1 TABLET (10 MG) BY MOUTH DAILY. 90 tablet 0    clobetasol (TEMOVATE) 0.05 % ointment Apply topically 2 (two) times daily 30 g 0    Ferrous Sulfate (IRON PO) Take 125 mg by mouth daily Slow release      fluticasone (FLONASE) 50 MCG/ACT nasal spray 1 spray by Nasal route daily      Norethin  Ace-Eth Estrad-FE (HAILEY 24 FE PO) Take by mouth daily      nystatin (MYCOSTATIN) cream Apply topically as needed (vulvar itching) 30 g 1    Riboflavin 400 MG Cap Take 1 capsule (400 mg) by mouth daily 30 capsule 6    VITAMIN D PO Take 2,000 IU by mouth every other day      EPINEPHrine 0.3 MG/0.3ML Solution Auto-injector injection Inject 0.3 mLs (0.3 mg) into the muscle once for 1 dose (Patient not taking: Reported on 04/15/2022) 0.3 mL 0    norethin ace-eth estrad-FE (Hailey 24 Fe) 1-20 MG-MCG(24) per tablet Take 1 tablet by mouth daily 84 tablet 3     No current facility-administered medications for this visit.  SH:   Social History     Tobacco Use    Smoking status: Never     Passive exposure: Never    Smokeless tobacco: Never   Vaping Use    Vaping Use: Never used   Substance Use Topics    Alcohol use: Not Currently     Comment: socially    Drug use: Never       REVIEW OF SYSTEMS: All other systems reviewed and negative except as above.    PHYSICAL EXAMINATION   General Appearance: A well-appearing female in no acute distress.   Vital Signs: BP 124/79 (BP Site: Left arm, Patient Position: Sitting, Cuff Size: Medium)   Pulse 89   Ht 1.575 m (5\' 2" )   Wt 67.5 kg (148 lb 12.8 oz)   SpO2 99%   BMI 27.22 kg/m    HEENT: Sclera anicteric, conjunctiva without pallor, moist mucous membranes, normal dentition.   Neck: Supple without jugular venous distention. Normal carotid upstrokes without bruits.  Chest: Clear to auscultation bilaterally with good air movement and respiratory effort and no wheezes, rales, or rhonchi  Cardiovascular: Normal S1 and physiologically split S2 without murmurs, gallops or rub. PMI of normal size and nondisplaced.   Abdomen: Soft, nontender. No organomegaly.  No pulsatile masses or bruits.    Extremities: Warm without edema. All peripheral pulses are full and equal.  Skin: No rash, xanthoma or xanthelasma.   Neuro: Alert and oriented x3. Grossly intact. Strength is symmetrical.  Normal mood and affect.       Basic Metabolic Profile   Lab Results   Component Value Date    NA 139 03/22/2022    K 4.5 03/22/2022    BUN 8.0 03/22/2022    CREAT 0.8 03/22/2022    CA 9.2 03/22/2022    GLU 82 03/22/2022         Cardiac Biomarkers   No results found for: "TROPONIN", "BNP"     CBC with Diff   Lab Results   Component Value Date    WBC 4.58 03/22/2022    HGB 12.5 03/22/2022    HCT 41.2 03/22/2022    PLT 352 (H) 03/22/2022         Cholesterol Panel   Lab Results   Component Value Date    CHOL 167 03/22/2022    HDL 63 03/22/2022    LDL 88 03/22/2022    TRIG 80 03/22/2022         Endocrine   Lab Results   Component Value Date    HGBA1C 5.3 03/22/2022    HGBA1C 5.3 03/17/2021    HGBA1C 5.2 12/13/2019    TSH 1.18 03/22/2022         Coagulation Studies   No results found for: "INR", "DDIMER"      ----------------------------  Phillis Haggis, AGNP-C  [facility] Cardiology [facility]   Tel.: [phone], Fax: [phone]  [address], [place], [place] [address]  [address], [place], [place] [address]  _____________________________      Incident to service performed with physician present in the office. The physician's plan of care was implemented.

## 2022-06-04 ENCOUNTER — Other Ambulatory Visit (INDEPENDENT_AMBULATORY_CARE_PROVIDER_SITE_OTHER): Payer: Self-pay

## 2022-06-04 DIAGNOSIS — R21 Rash and other nonspecific skin eruption: Secondary | ICD-10-CM

## 2022-06-06 ENCOUNTER — Ambulatory Visit (INDEPENDENT_AMBULATORY_CARE_PROVIDER_SITE_OTHER): Payer: BC Managed Care – PPO | Admitting: Internal Medicine

## 2022-06-06 ENCOUNTER — Encounter (INDEPENDENT_AMBULATORY_CARE_PROVIDER_SITE_OTHER): Payer: Self-pay | Admitting: Internal Medicine

## 2022-06-06 VITALS — BP 130/83 | HR 86 | Temp 97.7°F | Resp 16 | Wt 147.8 lb

## 2022-06-06 DIAGNOSIS — J00 Acute nasopharyngitis [common cold]: Secondary | ICD-10-CM

## 2022-06-06 MED ORDER — METHYLPREDNISOLONE 4 MG PO TBPK
ORAL_TABLET | ORAL | 0 refills | Status: AC
Start: 2022-06-06 — End: 2022-06-12

## 2022-06-06 MED ORDER — METHYLPREDNISOLONE 4 MG PO TBPK
ORAL_TABLET | ORAL | 0 refills | Status: DC
Start: 2022-06-06 — End: 2022-06-06

## 2022-06-06 MED ORDER — IPRATROPIUM BROMIDE 0.03 % NA SOLN
2.0000 | Freq: Two times a day (BID) | NASAL | 0 refills | Status: DC | PRN
Start: 2022-06-06 — End: 2022-06-28

## 2022-06-06 NOTE — Progress Notes (Signed)
Subjective:      Date: 06/06/2022 6:20 PM   Patient ID: Sarah Ball is a 26 y.o. female.    Chief Complaint:  Chief Complaint   Patient presents with    Ear Fullness     Pt states that both her ears feel clogged and has been having pain as well with drainage.     URI     Pt is also stating that she has had chest congestion x 1 week.     Tinnitus       HPI:  HPI  Patient is a 26 year old female presenting with complaints of: Well  Congestion nasal and chest x1week, + cough nonproductive x1week  Ringing and fullness in ears x3days  Takes zyrtec daily, but changed to zyrtec D on Fri.  States she also takes Flonase      Problem List:  Patient Active Problem List   Diagnosis    Seasonal allergies    History of palpitations    High risk medication use    Positive ANA (antinuclear antibody)    Chronic fatigue       Current Medications:  Outpatient Medications Marked as Taking for the 06/06/22 encounter (Office Visit) with Kela Millin, MD   Medication Sig Dispense Refill    cetirizine (ZyrTEC) 10 MG tablet TAKE 1 TABLET (10 MG) BY MOUTH DAILY. 90 tablet 0    clobetasol (TEMOVATE) 0.05 % ointment Apply topically 2 (two) times daily 30 g 0    Ferrous Sulfate (IRON PO) Take 125 mg by mouth daily Slow release      fluticasone (FLONASE) 50 MCG/ACT nasal spray 1 spray by Nasal route daily      norethin ace-eth estrad-FE (Hailey 24 Fe) 1-20 MG-MCG(24) per tablet Take 1 tablet by mouth daily 84 tablet 3    nystatin (MYCOSTATIN) cream Apply topically as needed (vulvar itching) 30 g 1    Riboflavin 400 MG Cap Take 1 capsule (400 mg) by mouth daily 30 capsule 6    VITAMIN D PO Take 2,000 IU by mouth every other day         Allergies:  Allergies   Allergen Reactions    Azithromycin Anaphylaxis    Phenytoin Anaphylaxis    Azo [Phenazopyridine]      rash    Magnesium Rash    Other Nausea And Vomiting and Headaches     Red Dye    Red Dye Nausea And Vomiting and Other (See Comments)       Past Medical History:  Past Medical  History:   Diagnosis Date    Anti-NMDA receptor encephalitis 2014       Past Surgical History:  Past Surgical History:   Procedure Laterality Date    TONSILLECTOMY         Family History:  Family History   Problem Relation Age of Onset    Hypertension Mother     No known problems Father     Cancer Maternal Grandmother         breast ca    Hypertension Maternal Grandmother     Breast cancer Maternal Grandmother 18    Heart failure Maternal Grandmother     Cancer Maternal Grandfather         prostate    Hypertension Maternal Grandfather     Colon cancer Neg Hx     Diabetes Neg Hx     Eclampsia Neg Hx     Miscarriages / Stillbirths Neg Hx  Ovarian cancer Neg Hx     Preterm labor Neg Hx     Stroke Neg Hx        Social History:  Social History     Socioeconomic History    Marital status: Single   Tobacco Use    Smoking status: Never     Passive exposure: Never    Smokeless tobacco: Never   Vaping Use    Vaping Use: Never used   Substance and Sexual Activity    Alcohol use: Not Currently     Comment: socially    Drug use: Never    Sexual activity: Not Currently     Birth control/protection: OCP     Social Determinants of Health     Financial Resource Strain: Low Risk  (04/24/2022)    Overall Financial Resource Strain (CARDIA)     Difficulty of Paying Living Expenses: Not hard at all   Food Insecurity: No Food Insecurity (04/24/2022)    Hunger Vital Sign     Worried About Running Out of Food in the Last Year: Never true     Ran Out of Food in the Last Year: Never true   Transportation Needs: No Transportation Needs (04/24/2022)    PRAPARE - Therapist, art (Medical): No     Lack of Transportation (Non-Medical): No   Physical Activity: Insufficiently Active (04/24/2022)    Exercise Vital Sign     Days of Exercise per Week: 4 days     Minutes of Exercise per Session: 30 min   Stress: Stress Concern Present (04/24/2022)    [employer] of Occupational Health - Occupational Stress Questionnaire      Feeling of Stress : To some extent   Social Connections: Moderately Integrated (04/24/2022)    Social Connection and Isolation Panel [NHANES]     Frequency of Communication with Friends and Family: More than three times a week     Frequency of Social Gatherings with Friends and Family: Twice a week     Attends Religious Services: More than 4 times per year     Active Member of [employer] or Organizations: Yes     Attends Engineer, structural: More than 4 times per year     Marital Status: Never married   Intimate Partner Violence: Not At Risk (04/24/2022)    Humiliation, Afraid, Rape, and Kick questionnaire     Fear of Current or Ex-Partner: No     Emotionally Abused: No     Physically Abused: No     Sexually Abused: No   Housing Stability: Low Risk  (04/24/2022)    Housing Stability Vital Sign     Unable to Pay for Housing in the Last Year: No     Number of Places Lived in the Last Year: 1     Unstable Housing in the Last Year: No        The following sections were reviewed this encounter by the provider:   Tobacco  Allergies  Meds  Problems  Med Hx  Surg Hx  Fam Hx         ROS:  General/Constitutional:   Denies Chills. Denies Fever.   Ophthalmologic:   Denies Blurred vision. Denies Eye Pain.   ENT:   As above Denies Ear pain. Denies Sinus pain.   Endocrine:   Denies Polydipsia. Denies Polyuria.   Respiratory:   As above. Denies Shortness of breath. Denies Wheezing.   Cardiovascular:   Denies Chest  pain. . Denies Swelling in hands/feet.   Gastrointestinal:   Denies Abdominal pain.Denies Nausea. Denies Vomiting.   Genitourinary:   Denies Blood in urine. Denies Frequent urination. Denies Painful urination.   Musculoskeletal:    As above.  Denies Leg cramps. Denies Muscle aches.   Skin:    Denies skin rash  Neurologic:   Denies Dizziness.        Objective:   Vitals:  BP 130/83 (BP Site: Left arm, Patient Position: Sitting, Cuff Size: Medium)   Pulse 86   Temp 97.7 F (36.5 C)   Resp 16   Wt 67 kg (147 lb  12.8 oz)   LMP 05/23/2022 (Approximate)   SpO2 100%   BMI 27.03 kg/m       Physical Exam:  General Examination:   GENERAL APPEARANCE: alert, in no acute distress, well developed, well nourished  oriented to time, place, and person.   H EENT: PERRL, EOMI, sclera anicteric.  Nasal mucosa bilaterally edematous/partially obstructed, erythematous.  No sinus tenderness  TMs clear bilaterally, EACs clear bilaterally  HEART: S1, S2 normal,regular rate and rhythm.   LUNGS: normal effort / no distress, normal breath sounds, clear to auscultation  bilaterally, no wheezes, rales, rhonchi.   ABDOMEN:soft, +BS, NT/ND, no masses palpable  EXTREMITIES: No LE edema bilaterally.   NEURO:CN2-12 grossly intact, nonfocal  PSYCH: alert and oriented to time,place and person.   SKIN: moist, no focal rash    Assessment:       1. Acute rhinitis/URI  - ipratropium (ATROVENT) 0.03 % nasal spray; 2 sprays by Nasal route 2 (two) times daily as needed for Rhinitis  Dispense: 30 mL; Refill: 0  - methylPREDNISolone (MEDROL DOSEPAK) 4 MG tablet; Follow Package Directions. TAKE WITH FOOD  Dispense: 21 tablet; Refill: 0  -Supportive care/optimize hydration  -Follow-up as needed if any worsening    Plan:   AS ABOVE      Kela Millin, MD

## 2022-06-06 NOTE — Progress Notes (Signed)
Have you seen any specialists/other providers since your last visit with [place]?    No    Health Maintenance Due   Topic Date Due    Tetanus Ten-Year  Never done    INFLUENZA VACCINE  Never done    COVID-19 Vaccine (2 - 2023-24 season) 03/18/2022

## 2022-06-08 ENCOUNTER — Ambulatory Visit (INDEPENDENT_AMBULATORY_CARE_PROVIDER_SITE_OTHER): Payer: BC Managed Care – PPO | Admitting: Internal Medicine

## 2022-06-23 ENCOUNTER — Encounter (HOSPITAL_BASED_OUTPATIENT_CLINIC_OR_DEPARTMENT_OTHER): Payer: Self-pay | Admitting: Nurse Practitioner

## 2022-06-23 ENCOUNTER — Ambulatory Visit (INDEPENDENT_AMBULATORY_CARE_PROVIDER_SITE_OTHER): Payer: BC Managed Care – PPO | Admitting: Nurse Practitioner

## 2022-06-23 VITALS — BP 126/79 | HR 77 | Temp 98.2°F | Ht 62.0 in | Wt 150.2 lb

## 2022-06-23 DIAGNOSIS — R102 Pelvic and perineal pain: Secondary | ICD-10-CM

## 2022-06-23 DIAGNOSIS — N9089 Other specified noninflammatory disorders of vulva and perineum: Secondary | ICD-10-CM

## 2022-06-23 MED ORDER — NYSTATIN-TRIAMCINOLONE 100000-0.1 UNIT/GM-% EX CREA
TOPICAL_CREAM | Freq: Three times a day (TID) | CUTANEOUS | 1 refills | Status: DC | PRN
Start: 2022-06-23 — End: 2023-02-20

## 2022-06-23 NOTE — Progress Notes (Signed)
CC/HPI    Sarah Ball is a 26 y.o. female, established patient here today for vulvar irritation and pelvic pain.    C/o symptoms on and off x 1 month.  C/o external itching/burning/discomforts that travels to upper thighs.  Start Monistat 3, but only used it x 1 day because it caused 15 mins of abdominal pain after she first used it.  Has also been using external Monistat cream which has seemed to help.  Has noticed pain with wiping.  Denies dysuria.    Also c/o LLQ pain that feels like aching.    HISTORY    GYN Hx  Sexually Active: not currently  Birth Control: Hailey Fe 1/20  Menses: 13/4-5/monthly    Last pap: 12/2020, negative  Hx abnormal paps: denies  Colposcopy:  N/A  Hx STDs:  denies    OB Hx  [id number]    Allergies  Allergies   Allergen Reactions    Azithromycin Anaphylaxis    Phenytoin Anaphylaxis    Azo [Phenazopyridine]      rash    Magnesium Rash    Other Nausea And Vomiting and Headaches     Red Dye    Red Dye Nausea And Vomiting and Other (See Comments)       Medications  Current Outpatient Medications   Medication Sig Dispense Refill    cetirizine (ZyrTEC) 10 MG tablet TAKE 1 TABLET (10 MG) BY MOUTH DAILY. 90 tablet 0    clobetasol (TEMOVATE) 0.05 % ointment Apply topically 2 (two) times daily 30 g 0    EPINEPHrine 0.3 MG/0.3ML Solution Auto-injector injection Inject 0.3 mLs (0.3 mg) into the muscle once for 1 dose 0.3 mL 0    Ferrous Sulfate (IRON PO) Take 125 mg by mouth daily Slow release      fluticasone (FLONASE) 50 MCG/ACT nasal spray 1 spray by Nasal route daily      ipratropium (ATROVENT) 0.03 % nasal spray 2 sprays by Nasal route 2 (two) times daily as needed for Rhinitis 30 mL 0    norethin ace-eth estrad-FE (Hailey 24 Fe) 1-20 MG-MCG(24) per tablet Take 1 tablet by mouth daily 84 tablet 3    nystatin (MYCOSTATIN) cream Apply topically as needed (vulvar itching) 30 g 1    Riboflavin 400 MG Cap Take 1 capsule (400 mg) by mouth daily 30 capsule 6    VITAMIN D PO Take 2,000 IU by mouth every  other day      nystatin-triamcinolone (MYCOLOG II) cream Apply topically 3 (three) times daily as needed (vulvar irritation) 30 g 1     No current facility-administered medications for this visit.       PMH  Past Medical History:   Diagnosis Date    Anti-NMDA receptor encephalitis 2014           PSH  Past Surgical History:   Procedure Laterality Date    TONSILLECTOMY         SH  Social History     Socioeconomic History    Marital status: Single   Tobacco Use    Smoking status: Never     Passive exposure: Never    Smokeless tobacco: Never   Vaping Use    Vaping Use: Never used   Substance and Sexual Activity    Alcohol use: Not Currently     Comment: socially    Drug use: Never    Sexual activity: Not Currently     Birth control/protection: OCP     Social Determinants  of Health     Financial Resource Strain: Low Risk  (06/23/2022)    Overall Financial Resource Strain (CARDIA)     Difficulty of Paying Living Expenses: Not hard at all   Food Insecurity: No Food Insecurity (06/23/2022)    Hunger Vital Sign     Worried About Running Out of Food in the Last Year: Never true     Ran Out of Food in the Last Year: Never true   Transportation Needs: No Transportation Needs (06/23/2022)    PRAPARE - Therapist, art (Medical): No     Lack of Transportation (Non-Medical): No   Physical Activity: Sufficiently Active (06/23/2022)    Exercise Vital Sign     Days of Exercise per Week: 4 days     Minutes of Exercise per Session: 40 min   Recent Concern: Physical Activity - Insufficiently Active (04/24/2022)    Exercise Vital Sign     Days of Exercise per Week: 4 days     Minutes of Exercise per Session: 30 min   Stress: Stress Concern Present (06/23/2022)    [employer] of Occupational Health - Occupational Stress Questionnaire     Feeling of Stress : To some extent   Social Connections: Moderately Integrated (06/23/2022)    Social Connection and Isolation Panel [NHANES]     Frequency of Communication with  Friends and Family: More than three times a week     Frequency of Social Gatherings with Friends and Family: Twice a week     Attends Religious Services: More than 4 times per year     Active Member of [employer] or Organizations: Yes     Attends Engineer, structural: More than 4 times per year     Marital Status: Never married   Intimate Partner Violence: Not At Risk (06/23/2022)    Humiliation, Afraid, Rape, and Kick questionnaire     Fear of Current or Ex-Partner: No     Emotionally Abused: No     Physically Abused: No     Sexually Abused: No   Housing Stability: Low Risk  (06/23/2022)    Housing Stability Vital Sign     Unable to Pay for Housing in the Last Year: No     Number of Places Lived in the Last Year: 1     Unstable Housing in the Last Year: No       FH  Family History   Problem Relation Age of Onset    Hypertension Mother     No known problems Father     Hypertension Maternal Grandmother     Breast cancer Maternal Grandmother 23    Heart failure Maternal Grandmother     Hypertension Maternal Grandfather     Prostate cancer Maternal Grandfather     Colon cancer Neg Hx     Diabetes Neg Hx     Eclampsia Neg Hx     Miscarriages / Stillbirths Neg Hx     Ovarian cancer Neg Hx     Preterm labor Neg Hx     Stroke Neg Hx        VITAL SIGNS  BP 126/79   Pulse 77   Temp 98.2 F (36.8 C)   Ht 5\' 2"  (1.575 m)   Wt 150 lb 3.2 oz (68.1 kg)   LMP 06/16/2022 (Approximate)   BMI 27.47 kg/m     EXAM    General Appearance  Alert, Well appearing, Oriented x 3, In  no distress    Pelvic  Vulva:  normal, no lesions, normal clitorus  Perineum:  intact  Introitus:  normal, no lesions, no discharge  Urethra:  normal  Vagina:  normal, no lesions, no discharge, no masses, no tenderness  Cervix:  normal, no discharge, no lesions, non-friable  Uterus:  normal, not enlarged, no masses  Adnexa:  Left - normal, non-tender, not enlarged; Right - normal, non-tender, not enlarged  Rectal:  deferred    ASSESSMENT  1. Vulvar  irritation    2. Pelvic pain in female        PLAN  Orders Placed This Encounter   Procedures    NuSwab Vaginitis Plus (VG+)    US Transvaginal Only Non-OB     Pt aware normal results will only be sent via My Chart account.  Normal exam.  Will determine plan once results are received.  Will try Mycolog II for now.    Annaliese Saez L. Rahi Chandonnet, MS, RN, WHNP-BC

## 2022-06-26 LAB — NUSWAB VAGINITIS PLUS (VG+)
CHLAMYDIA TRACHOMATIS, NAA: NEGATIVE
Candida albicans, NAA: NEGATIVE
Candida glabrata, NAA: NEGATIVE
Neisseria gonorrhoeae, NAA: NEGATIVE
TRICH VAG BY NAA: NEGATIVE

## 2022-06-28 ENCOUNTER — Other Ambulatory Visit (INDEPENDENT_AMBULATORY_CARE_PROVIDER_SITE_OTHER): Payer: Self-pay | Admitting: Internal Medicine

## 2022-06-28 DIAGNOSIS — J00 Acute nasopharyngitis [common cold]: Secondary | ICD-10-CM

## 2022-07-13 ENCOUNTER — Encounter (INDEPENDENT_AMBULATORY_CARE_PROVIDER_SITE_OTHER): Payer: Self-pay | Admitting: Internal Medicine

## 2022-07-14 ENCOUNTER — Telehealth (INDEPENDENT_AMBULATORY_CARE_PROVIDER_SITE_OTHER): Payer: BC Managed Care – PPO

## 2022-07-14 ENCOUNTER — Encounter (INDEPENDENT_AMBULATORY_CARE_PROVIDER_SITE_OTHER): Payer: Self-pay

## 2022-07-14 DIAGNOSIS — U071 COVID-19: Secondary | ICD-10-CM

## 2022-07-14 MED ORDER — BENZONATATE 200 MG PO CAPS
200.0000 mg | ORAL_CAPSULE | Freq: Three times a day (TID) | ORAL | 0 refills | Status: DC | PRN
Start: 2022-07-14 — End: 2023-02-20

## 2022-07-14 MED ORDER — NIRMATRELVIR&RITONAVIR 300/100 20 X 150 MG & 10 X 100MG PO TBPK
3.0000 | ORAL_TABLET | Freq: Two times a day (BID) | ORAL | 0 refills | Status: AC
Start: 2022-07-14 — End: 2022-07-19

## 2022-07-14 NOTE — Progress Notes (Addendum)
[facility]                       Date of Exam: 07/14/2022 3:40 PM        Patient ID: Sarah Ball is a 26 y.o. female.  Provider: Quay Burow, DNP FNP      This visit was conducted with the use of interactive video and audio telecommunication that permitted real time communication between Ball,Sarah and Quay Burow, DNP FNP. She consented to participation and received services at home to minimize exposure to COVID-19 while I was located at my office at [facility].         Chief Complaint:    Chief Complaint   Patient presents with    Covid positive     Tested positive on Wednesday.  Sxs started Tuesday.   Current sxs are cough, congestion, fatigue, sore throat. Had fever Tuesday night and Wednesday morning.   Has been taking Ibuprofen.                HPI:    HPI  26 year old female presents via telemedicine reporting she tested COVID positive yesterday with symptoms of cough, nasal congestion, fatigue, and sore throat that started this Tuesday. Reports she had a fever Tuesday night and Wednesday morning that has now resolved.  Reports she has been taking ibuprofen with some relief. Denies CP, SOB, wheezing, fever, chills, body/muscle aches, HA, N/V, diarrhea, earache, or other symptoms. Reports she is interested in Paxlovid.  Denies kidney or liver dysfunction.            Problem List:    Patient Active Problem List   Diagnosis    Seasonal allergies    History of palpitations    High risk medication use    Positive ANA (antinuclear antibody)    Chronic fatigue             Current Meds:    Outpatient Medications Marked as Taking for the 07/14/22 encounter (Telemedicine Visit) with Quay Burow, DNP FNP   Medication Sig Dispense Refill    cetirizine (ZyrTEC) 10 MG tablet TAKE 1 TABLET (10 MG) BY MOUTH DAILY. (Patient taking differently: Take 1 tablet (10 mg) by mouth daily Every other day) 90 tablet 0    clobetasol (TEMOVATE) 0.05 % ointment Apply  topically 2 (two) times daily 30 g 0    Ferrous Sulfate (IRON PO) Take 125 mg by mouth daily Slow release      fluticasone (FLONASE) 50 MCG/ACT nasal spray 1 spray by Nasal route daily      ipratropium (ATROVENT) 0.03 % nasal spray SPRAY 2 SPRAYS BY NASAL ROUTE TWICE A DAY AS NEEDED FOR RHINITIS 90 mL 0    norethin ace-eth estrad-FE (Hailey 24 Fe) 1-20 MG-MCG(24) per tablet Take 1 tablet by mouth daily 84 tablet 3    nystatin (MYCOSTATIN) cream Apply topically as needed (vulvar itching) 30 g 1    nystatin-triamcinolone (MYCOLOG II) cream Apply topically 3 (three) times daily as needed (vulvar irritation) 30 g 1    Riboflavin 400 MG Cap Take 1 capsule (400 mg) by mouth daily 30 capsule 6    VITAMIN D PO Take 2,000 IU by mouth every other day            Allergies:    Allergies   Allergen Reactions    Azithromycin Anaphylaxis    Phenytoin Anaphylaxis    Azo [Phenazopyridine]      rash  Magnesium Rash    Other Nausea And Vomiting and Headaches     Red Dye    Red Dye Nausea And Vomiting and Other (See Comments)             Past Surgical History:    Past Surgical History:   Procedure Laterality Date    TONSILLECTOMY             Family History:    Family History   Problem Relation Age of Onset    Hypertension Mother     No known problems Father     Hypertension Maternal Grandmother     Breast cancer Maternal Grandmother 59    Heart failure Maternal Grandmother     Hypertension Maternal Grandfather     Prostate cancer Maternal Grandfather     Colon cancer Neg Hx     Diabetes Neg Hx     Eclampsia Neg Hx     Miscarriages / Stillbirths Neg Hx     Ovarian cancer Neg Hx     Preterm labor Neg Hx     Stroke Neg Hx            Social History:    Social History     Tobacco Use    Smoking status: Never     Passive exposure: Never    Smokeless tobacco: Never   Vaping Use    Vaping Use: Never used   Substance Use Topics    Alcohol use: Not Currently     Comment: socially    Drug use: Never           The following sections were  reviewed this encounter by the provider:   Tobacco  Allergies  Meds  Problems  Med Hx  Surg Hx  Fam Hx             Vital Signs:    LMP 06/16/2022 (Approximate)          ROS:    Review of Systems   Constitutional:  Positive for fatigue and fever (resolved). Negative for chills and diaphoresis.   HENT:  Positive for congestion and sore throat. Negative for ear discharge, ear pain, postnasal drip, sinus pressure, sinus pain and trouble swallowing.    Eyes:  Negative for itching and visual disturbance.   Respiratory:  Positive for cough. Negative for shortness of breath and wheezing.    Cardiovascular:  Negative for chest pain.   Gastrointestinal:  Negative for abdominal pain, diarrhea, nausea and vomiting.   Neurological:  Negative for headaches.                Physical Exam:    Physical Exam  Constitutional:       General: She is not in acute distress.     Appearance: Normal appearance. She is not ill-appearing, toxic-appearing or diaphoretic.   Pulmonary:      Effort: Pulmonary effort is normal. No respiratory distress.      Comments: Patient is able to speak in full sentences without issues.  Neurological:      Mental Status: She is alert.   Psychiatric:         Attention and Perception: Attention normal.         Mood and Affect: Mood normal.         Speech: Speech normal.         Behavior: Behavior is cooperative.                Assessment/Plan:  1. COVID-19 virus infection  - nirmatrelvir-ritonavir (PAXLOVID) 20 x 150 MG & 10 x 100MG  dose pack(emergency use authorization); Take 3 tablets by mouth 2 (two) times daily for 5 days The dosage for PAXLOVID is 300 mg nirmatrelvir (two 150 mg tablets) with 100 mg ritonavir (one 100 mg tablet) with all three tablets taken together.  Dispense: 30 tablet; Refill: 0  - benzonatate (TESSALON) 200 MG capsule; Take 1 capsule (200 mg) by mouth 3 (three) times daily as needed for Cough  Dispense: 30 capsule; Refill: 0  - Take medications as prescribed; reviewed SE  - Pt  does not have kidney/liver dysfunction  - Use a humidifier at bedtime and OTC Flonase to reduce nasal congestion and inflammation  - Increase your fluid and electrolyte intake to prevent dehydration  - Have adequate amount of rest  - Consume warm fluids and OTC cough drops and lozenges for cough and sore throat relief  - Make sure to eat nutritious meals and snacks throughout the day  - Perform frequent handwashing  - Use OTC Tylenol/Ibuprofen as needed to manage pain and fever  - Isolate for 5 days and if you have a fever, isolate until your fever resolves  - ED precautions reviewed; pt verbalized understanding  - Notify the clinic if symptoms worsen, persist, or onset of new symptoms occur        Reviewed treatment plan inclusive of risks and benefits. Patient agreeable with plan, all questions answered. ED precautions reviewed; patient verbalized understanding.  Time spent in discussion: 10 minutes            Follow-up:    Return if symptoms worsen or fail to improve.             Quay BurowSheema Deven Furia, DNP FNP

## 2022-10-04 ENCOUNTER — Other Ambulatory Visit (INDEPENDENT_AMBULATORY_CARE_PROVIDER_SITE_OTHER): Payer: Self-pay | Admitting: Internal Medicine

## 2022-10-04 DIAGNOSIS — J00 Acute nasopharyngitis [common cold]: Secondary | ICD-10-CM

## 2022-10-05 NOTE — Telephone Encounter (Signed)
Last o/v 06/06/22  Appt. No appt scheduled   Sent 1 bottle 0 refills

## 2022-10-14 ENCOUNTER — Other Ambulatory Visit (INDEPENDENT_AMBULATORY_CARE_PROVIDER_SITE_OTHER): Payer: Self-pay | Admitting: Internal Medicine

## 2022-10-14 DIAGNOSIS — J00 Acute nasopharyngitis [common cold]: Secondary | ICD-10-CM

## 2023-01-20 ENCOUNTER — Other Ambulatory Visit (HOSPITAL_BASED_OUTPATIENT_CLINIC_OR_DEPARTMENT_OTHER): Payer: Self-pay | Admitting: Nurse Practitioner

## 2023-02-11 ENCOUNTER — Other Ambulatory Visit (HOSPITAL_BASED_OUTPATIENT_CLINIC_OR_DEPARTMENT_OTHER): Payer: Self-pay | Admitting: Nurse Practitioner

## 2023-02-13 NOTE — Telephone Encounter (Signed)
Will refill 90 days once pt returns for annual exam.

## 2023-02-20 ENCOUNTER — Encounter (HOSPITAL_BASED_OUTPATIENT_CLINIC_OR_DEPARTMENT_OTHER): Payer: Self-pay | Admitting: Nurse Practitioner

## 2023-02-20 ENCOUNTER — Ambulatory Visit (INDEPENDENT_AMBULATORY_CARE_PROVIDER_SITE_OTHER): Payer: BLUE CROSS/BLUE SHIELD | Admitting: Nurse Practitioner

## 2023-02-20 VITALS — BP 132/83 | HR 87 | Wt 153.8 lb

## 2023-02-20 DIAGNOSIS — B354 Tinea corporis: Secondary | ICD-10-CM

## 2023-02-20 DIAGNOSIS — Z01419 Encounter for gynecological examination (general) (routine) without abnormal findings: Secondary | ICD-10-CM

## 2023-02-20 DIAGNOSIS — Z3041 Encounter for surveillance of contraceptive pills: Secondary | ICD-10-CM

## 2023-02-20 MED ORDER — BLISOVI 24 FE 1-20 MG-MCG(24) PO TABS
1.0000 | ORAL_TABLET | Freq: Every day | ORAL | 3 refills | Status: DC
Start: 2023-02-20 — End: 2024-02-07

## 2023-02-20 NOTE — Progress Notes (Signed)
CC/HPI    Sarah Ball is a 27 y.o. female, established patient, here today for annual gyn exam.    Continues to have night sweats and chills which are heightened the week before her menses.  Also experiencing weight gain.  Reports she followed up with PCP in 04/2022 and had normal thyroid exam.    Also c/o rash on upper thigh that has come back and itches.  Usually the steroid cream helps it, but it hasn't as much this time.    GYN Hx   Sexually Active: not currently  Birth Control: Blisovi 24  Menses: 13/4-5/monthly    Last pap: 12/2020, negative  Hx abnormal paps: denies  Colposcopy:  N/A  Hx STDs:  denies    OB Hx  [id number]    Allergies  Allergies[1]    Medications  Current Medications[2]    PMH  Medical History[3]        PSH  Past Surgical History:   Procedure Laterality Date    TONSILLECTOMY         SH  Social History[4]    FH  Family History   Problem Relation Age of Onset    Hypertension Mother     No known problems Father     Hypertension Maternal Grandmother     Breast cancer Maternal Grandmother 90    Heart failure Maternal Grandmother     Hypertension Maternal Grandfather     Prostate cancer Maternal Grandfather     Colon cancer Neg Hx     Diabetes Neg Hx     Eclampsia Neg Hx     Miscarriages / Stillbirths Neg Hx     Ovarian cancer Neg Hx     Preterm labor Neg Hx     Stroke Neg Hx        VITAL SIGNS  BP 132/83   Pulse 87   Wt 153 lb 12.8 oz (69.8 kg)   LMP 01/27/2023 (Exact Date)   BMI 28.13 kg/m     EXAM  GENERAL APPEARANCE    Alert, Well appearing, Oriented x 3, In no distress    BREAST  Sexual Maturity:  Tanner 5  Nipples:  Left - normal, no discharge; Right - normal, no discharge  Breast:  symmetrical; Left - normal, non-tender, no mass; Right - normal, non-tender, no mass    PELVIC  Vulva:  normal, no lesions, normal clitorus  Perineum:  intact  Introitus:  normal, no lesions, no discharge  Urethra:  normal  Vagina:  normal, no lesions, no masses, no tenderness  Cervix:  normal, no discharge,  no lesions, non-friable  Uterus:  normal, not enlarged, no masses  Adnexa:  Left - normal, non-tender, not enlarged; Right - normal, non-tender, not enlarged  Rectal:  deferred    ASSESSMENT  1. Encounter for gynecological examination (general) (routine) without abnormal findings    2. Surveillance for birth control, oral contraceptives    3. Tinea corporis        PLAN  Orders Placed This Encounter   Medications    norethin ace-eth estrad-FE (Blisovi 24 Fe) 1-20 MG-MCG(24) per tablet     Sig: Take 1 tablet by mouth daily     Dispense:  112 tablet     Refill:  3     Takes active pills continuously, will need refill sooner than usual      Pt aware normal results will be sent via My Chart.  Reviewed pap guidelines.  Reviewed again that night sweats are  unlikely related to OCPs.  Discussed stop OCPs all together vs taking them continuously.  Desires 2nd option.  Will alert me if not helping in 3 months.  Discussed ringworm on left thigh.  Encouraged Lotrimen cream.    Ashleigh Luckow L. Patrici Minnis, MS, RN, WHNP-BC         [1]   Allergies  Allergen Reactions    Azithromycin Anaphylaxis    Phenytoin Anaphylaxis    Azo [Phenazopyridine]      rash    Magnesium Rash    Other Nausea And Vomiting and Headaches     Red Dye    Red Dye Nausea And Vomiting and Other (See Comments)   [2]   Current Outpatient Medications   Medication Sig Dispense Refill    clobetasol (TEMOVATE) 0.05 % ointment Apply topically 2 (two) times daily 30 g 0    Ferrous Sulfate (IRON PO) Take 125 mg by mouth daily Slow release      fluticasone (FLONASE) 50 MCG/ACT nasal spray 1 spray by Nasal route daily      ipratropium (ATROVENT) 0.03 % nasal spray SPRAY 2 SPRAYS BY NASAL ROUTE TWICE A DAY AS NEEDED FOR RHINITIS 30 mL 0    nystatin (MYCOSTATIN) cream Apply topically as needed (vulvar itching) 30 g 1    Riboflavin 400 MG Cap Take 1 capsule (400 mg) by mouth daily 30 capsule 6    VITAMIN D PO Take 2,000 IU by mouth every other day      EPINEPHrine 0.3 MG/0.3ML  Solution Auto-injector injection Inject 0.3 mLs (0.3 mg) into the muscle once for 1 dose 0.3 mL 0    norethin ace-eth estrad-FE (Blisovi 24 Fe) 1-20 MG-MCG(24) per tablet Take 1 tablet by mouth daily 112 tablet 3     No current facility-administered medications for this visit.   [3]   Past Medical History:  Diagnosis Date    Anti-NMDA receptor encephalitis 2014   [4]   Social History  Socioeconomic History    Marital status: Single   Tobacco Use    Smoking status: Never     Passive exposure: Never    Smokeless tobacco: Never   Vaping Use    Vaping status: Never Used   Substance and Sexual Activity    Alcohol use: Not Currently     Comment: socially    Drug use: Never    Sexual activity: Not Currently     Partners: Male     Birth control/protection: OCP     Social Determinants of Health     Financial Resource Strain: Low Risk  (02/19/2023)    Overall Financial Resource Strain (CARDIA)     Difficulty of Paying Living Expenses: Not hard at all   Food Insecurity: No Food Insecurity (02/19/2023)    Hunger Vital Sign     Worried About Running Out of Food in the Last Year: Never true     Ran Out of Food in the Last Year: Never true   Transportation Needs: No Transportation Needs (02/19/2023)    PRAPARE - Therapist, art (Medical): No     Lack of Transportation (Non-Medical): No   Physical Activity: Sufficiently Active (02/19/2023)    Exercise Vital Sign     Days of Exercise per Week: 4 days     Minutes of Exercise per Session: 50 min   Stress: Stress Concern Present (02/19/2023)    [employer] of Occupational Health - Occupational Stress Questionnaire     Feeling  of Stress : To some extent   Social Connections: Moderately Integrated (02/19/2023)    Social Connection and Isolation Panel [NHANES]     Frequency of Communication with Friends and Family: More than three times a week     Frequency of Social Gatherings with Friends and Family: Three times a week     Attends Religious Services: More than 4  times per year     Active Member of Clubs or Organizations: Yes     Attends Banker Meetings: More than 4 times per year     Marital Status: Never married   Intimate Partner Violence: Not At Risk (02/19/2023)    Humiliation, Afraid, Rape, and Kick questionnaire     Fear of Current or Ex-Partner: No     Emotionally Abused: No     Physically Abused: No     Sexually Abused: No   Housing Stability: Low Risk  (02/19/2023)    Housing Stability Vital Sign     Unable to Pay for Housing in the Last Year: No     Number of Times Moved in the Last Year: 0     Homeless in the Last Year: No

## 2023-02-22 ENCOUNTER — Telehealth (INDEPENDENT_AMBULATORY_CARE_PROVIDER_SITE_OTHER): Payer: BLUE CROSS/BLUE SHIELD | Admitting: Internal Medicine

## 2023-02-22 ENCOUNTER — Other Ambulatory Visit (FREE_STANDING_LABORATORY_FACILITY): Payer: BLUE CROSS/BLUE SHIELD

## 2023-02-22 ENCOUNTER — Encounter (INDEPENDENT_AMBULATORY_CARE_PROVIDER_SITE_OTHER): Payer: Self-pay | Admitting: Internal Medicine

## 2023-02-22 DIAGNOSIS — R61 Generalized hyperhidrosis: Secondary | ICD-10-CM

## 2023-02-22 DIAGNOSIS — R635 Abnormal weight gain: Secondary | ICD-10-CM

## 2023-02-22 LAB — CBC
Absolute nRBC: 0 10*3/uL (ref <=0.00)
Hematocrit: 37.3 % (ref 34.7–43.7)
Hemoglobin: 12.1 g/dL (ref 11.4–14.8)
MCH: 26.8 pg (ref 25.1–33.5)
MCHC: 32.4 g/dL (ref 31.5–35.8)
MCV: 82.5 fL (ref 78.0–96.0)
MPV: 11.1 fL (ref 8.9–12.5)
Platelet Count: 380 10*3/uL — ABNORMAL HIGH (ref 142–346)
RBC: 4.52 10*6/uL (ref 3.90–5.10)
RDW: 14 % (ref 11–15)
WBC: 6.68 10*3/uL (ref 3.10–9.50)
nRBC %: 0 /100 WBC (ref <=0.0)

## 2023-02-22 LAB — T4, FREE: T4 Free: 0.93 ng/dL (ref 0.69–1.48)

## 2023-02-22 LAB — SEDIMENTATION RATE: Sed Rate: 4 mm/Hr (ref <=20)

## 2023-02-22 LAB — EPSTEIN-BARR VIRUS (EBV) VCA, IGM: EBV VCA Ab, IgM: <10 U/mL (ref 0.0–35.9)

## 2023-02-22 LAB — TSH: TSH: 0.61 u[IU]/mL (ref 0.35–4.94)

## 2023-02-22 NOTE — Progress Notes (Signed)
Subjective:      Date: 02/22/2023 12:31 PM   Patient ID: Sarah Ball is a 27 y.o. female.    Chief Complaint:  Chief Complaint   Patient presents with    Night Sweats       HPI:  HPI  Verbal consent has been obtained from the patient to conduct a video and telephone: yes  Pt is a 27 year old female presenting with complaints of:  -having night sweats and also states during the day gets really hot, but no fever  States mentioned these to her GYN at physical she had there about a year ago bu they have gotten worse  Also reports weight gain of aboout 20llbs over a 6mos period despite exs regular exs  Also states her joints feel 'weak' and they crack and pop and this has been in the past 86mos>> it is noted that she was referred to rheumatologist for similar complaints and 2023 and evaluation/testing was negative.  No urinary frequency, dysuria, cough, diarrhea and no abdominal pain reported.  Also no swollen glands , and no joint swelling reported.  Problem List:  Problem List[1]    Current Medications:  Medications Taking[2]    Allergies:  Allergies[3]    Past Medical History:  Medical History[4]    Past Surgical History:  Past Surgical History:   Procedure Laterality Date    TONSILLECTOMY         Family History:  Family History   Problem Relation Age of Onset    Hypertension Mother     No known problems Father     Hypertension Maternal Grandmother     Breast cancer Maternal Grandmother 67    Heart failure Maternal Grandmother     Hypertension Maternal Grandfather     Prostate cancer Maternal Grandfather     Colon cancer Neg Hx     Diabetes Neg Hx     Eclampsia Neg Hx     Miscarriages / Stillbirths Neg Hx     Ovarian cancer Neg Hx     Preterm labor Neg Hx     Stroke Neg Hx        Social History:  Social History[5]     The following sections were reviewed this encounter by the provider:   Tobacco  Allergies  Meds  Problems  Med Hx  Surg Hx  Fam Hx         ROS:  General/Constitutional:   Denies Chills. Denies  Fatigue. Denies Fever.   Ophthalmologic:   Denies Blurred vision. Denies Eye Pain.   ENT:   Denies Nasal Discharge. Denies Ear pain. Denies Sinus pain.   Endocrine:   Denies Polydipsia. Denies Polyuria.   Respiratory:   Denies Cough.. Denies Shortness of breath. Denies Wheezing.   Cardiovascular:   Denies Chest pain. . Denies Swelling in hands/feet.   Gastrointestinal:   Denies Abdominal pain.Denies Nausea. Denies Vomiting.   Genitourinary:   Denies Blood in urine. Denies Frequent urination. Denies Painful urination.   Musculoskeletal:    As above.  Denies Leg cramps. Denies Muscle aches.   Skin:    Denies skin rash  Neurologic:   Denies Dizziness.  Denies Headache      Objective:   Vitals:  LMP 01/27/2023 (Exact Date)       Physical Exam:  General Examination:   GENERAL APPEARANCE: alert, in no acute distress, well developed, well nourished  oriented to time, place, and person.   Respirations/lungs: Nonlabored  PSYCH: alert and oriented to time,place  and person.   Exam limited as this is a video visit      Assessment:       1. Night sweats  - Sedimentation Rate (ESR); Future  - CBC without Differential; Future  - Epstein-Barr Virus (EBV) VCA, IgM; Future  -As above weight gain reported but no weight loss  -Further management recommendations pending above results  2. Weight gain  - TSH; Future  - T4, Free; Future  -Further management recommendations pending above results  -Continue lifestyle changes      Plan:   As above  Time spent : 20 minutes        Sonyia Muro Joselyn Glassman, MD          [1]   Patient Active Problem List  Diagnosis    Seasonal allergies    History of palpitations    High risk medication use    Positive ANA (antinuclear antibody)    Chronic fatigue   [2]   Outpatient Medications Marked as Taking for the 02/22/23 encounter (Telemedicine Visit) with Kela Millin, MD   Medication Sig Dispense Refill    clobetasol (TEMOVATE) 0.05 % ointment Apply topically 2 (two) times daily 30 g 0    Ferrous Sulfate (IRON  PO) Take 125 mg by mouth daily Slow release      fluticasone (FLONASE) 50 MCG/ACT nasal spray 1 spray by Nasal route daily      ipratropium (ATROVENT) 0.03 % nasal spray SPRAY 2 SPRAYS BY NASAL ROUTE TWICE A DAY AS NEEDED FOR RHINITIS 30 mL 0    norethin ace-eth estrad-FE (Blisovi 24 Fe) 1-20 MG-MCG(24) per tablet Take 1 tablet by mouth daily 112 tablet 3    nystatin (MYCOSTATIN) cream Apply topically as needed (vulvar itching) 30 g 1    Riboflavin 400 MG Cap Take 1 capsule (400 mg) by mouth daily 30 capsule 6    VITAMIN D PO Take 2,000 IU by mouth every other day     [3]   Allergies  Allergen Reactions    Azithromycin Anaphylaxis    Phenytoin Anaphylaxis    Azo [Phenazopyridine]      rash    Magnesium Rash    Other Nausea And Vomiting and Headaches     Red Dye    Red Dye Nausea And Vomiting and Other (See Comments)   [4]   Past Medical History:  Diagnosis Date    Anti-NMDA receptor encephalitis 2014   [5]   Social History  Socioeconomic History    Marital status: Single   Tobacco Use    Smoking status: Never     Passive exposure: Never    Smokeless tobacco: Never   Vaping Use    Vaping status: Never Used   Substance and Sexual Activity    Alcohol use: Not Currently     Comment: socially    Drug use: Never    Sexual activity: Not Currently     Partners: Male     Birth control/protection: OCP     Social Determinants of Health     Financial Resource Strain: Low Risk  (02/19/2023)    Overall Financial Resource Strain (CARDIA)     Difficulty of Paying Living Expenses: Not hard at all   Food Insecurity: No Food Insecurity (02/19/2023)    Hunger Vital Sign     Worried About Running Out of Food in the Last Year: Never true     Ran Out of Food in the Last Year: Never true   Transportation  Needs: No Transportation Needs (02/19/2023)    PRAPARE - Therapist, art (Medical): No     Lack of Transportation (Non-Medical): No   Physical Activity: Sufficiently Active (02/19/2023)    Exercise Vital Sign     Days of  Exercise per Week: 4 days     Minutes of Exercise per Session: 50 min   Stress: Stress Concern Present (02/19/2023)    [employer] of Occupational Health - Occupational Stress Questionnaire     Feeling of Stress : To some extent   Social Connections: Moderately Integrated (02/19/2023)    Social Connection and Isolation Panel [NHANES]     Frequency of Communication with Friends and Family: More than three times a week     Frequency of Social Gatherings with Friends and Family: Three times a week     Attends Religious Services: More than 4 times per year     Active Member of Clubs or Organizations: Yes     Attends Banker Meetings: More than 4 times per year     Marital Status: Never married   Intimate Partner Violence: Not At Risk (02/19/2023)    Humiliation, Afraid, Rape, and Kick questionnaire     Fear of Current or Ex-Partner: No     Emotionally Abused: No     Physically Abused: No     Sexually Abused: No   Housing Stability: Low Risk  (02/19/2023)    Housing Stability Vital Sign     Unable to Pay for Housing in the Last Year: No     Number of Times Moved in the Last Year: 0     Homeless in the Last Year: No

## 2023-02-22 NOTE — Progress Notes (Signed)
Have you seen any specialist/other providers since your last visit with [place]?  No    Are you located in the state of [place]?  Yes    Do you consent to this telemedicine visit?  Yes      Health Maintenance Due   Topic Date Due    Tetanus Ten-Year  Never done    COVID-19 Vaccine (2 - Novavax series) 03/18/2022    INFLUENZA VACCINE  Never done

## 2023-02-23 NOTE — Progress Notes (Signed)
Spoke to pt to inform her about her labs being normal and to follow up with her Rheumatologist as previously discussed with PCP. Pt verbalized understanding.

## 2023-03-18 ENCOUNTER — Other Ambulatory Visit (HOSPITAL_BASED_OUTPATIENT_CLINIC_OR_DEPARTMENT_OTHER): Payer: Self-pay | Admitting: Nurse Practitioner

## 2023-03-29 ENCOUNTER — Ambulatory Visit (INDEPENDENT_AMBULATORY_CARE_PROVIDER_SITE_OTHER): Payer: BLUE CROSS/BLUE SHIELD | Admitting: Internal Medicine

## 2023-03-29 ENCOUNTER — Encounter (INDEPENDENT_AMBULATORY_CARE_PROVIDER_SITE_OTHER): Payer: Self-pay | Admitting: Internal Medicine

## 2023-03-29 VITALS — BP 121/81 | HR 56 | Temp 98.0°F | Resp 14 | Wt 152.4 lb

## 2023-03-29 DIAGNOSIS — J029 Acute pharyngitis, unspecified: Secondary | ICD-10-CM

## 2023-03-29 DIAGNOSIS — J310 Chronic rhinitis: Secondary | ICD-10-CM

## 2023-03-29 LAB — POCT INFLUENZA A/B
POCT Rapid Influenza A AG: NEGATIVE
POCT Rapid Influenza B AG: NEGATIVE

## 2023-03-29 LAB — POCT RAPID STREP A: Rapid Strep A Screen POCT: NEGATIVE

## 2023-03-29 MED ORDER — METHYLPREDNISOLONE 4 MG PO TBPK
ORAL_TABLET | ORAL | 0 refills | Status: AC
Start: 2023-03-29 — End: 2023-04-04

## 2023-03-29 MED ORDER — BENZONATATE 200 MG PO CAPS
200.0000 mg | ORAL_CAPSULE | Freq: Three times a day (TID) | ORAL | 0 refills | Status: DC | PRN
Start: 2023-03-29 — End: 2023-08-24

## 2023-03-29 NOTE — Progress Notes (Signed)
Have you seen any specialists/other providers since your last visit with [place]?    No      Arm preference verified?   No        Health Maintenance Due   Topic Date Due    Tetanus Ten-Year  Never done    INFLUENZA VACCINE  Never done    DEPRESSION SCREENING  03/15/2023    COVID-19 Vaccine (2 - Novavax series) 03/19/2023

## 2023-03-29 NOTE — Progress Notes (Signed)
Subjective:      Date: 03/29/2023 5:58 PM   Patient ID: Sarah Ball is a 27 y.o. female.    Chief Complaint:  Chief Complaint   Patient presents with    Sore Throat     Since yesterday, did an at home covid test came back negative     Nasal Congestion       HPI:  HPI  Patient is a 27 year old female presenting with complaints of sorethroat since yesterday and nasal congestion, as well as + cough beginning today  Also both ears congested + pain x1.5week  + low grade temp and fatigue  Was taking sudafed initially but now just warm salt water gargles and ibuprofen.   States she sees an Proofreader for immunotherapy and takes Allegra daily    Problem List:  Problem List[1]    Current Medications:  Medications Taking[2]    Allergies:  Allergies[3]    Past Medical History:  Medical History[4]    Past Surgical History:  Past Surgical History:   Procedure Laterality Date    TONSILLECTOMY         Family History:  Family History   Problem Relation Age of Onset    Hypertension Mother     No known problems Father     Hypertension Maternal Grandmother     Breast cancer Maternal Grandmother 54    Heart failure Maternal Grandmother     Hypertension Maternal Grandfather     Prostate cancer Maternal Grandfather     Colon cancer Neg Hx     Diabetes Neg Hx     Eclampsia Neg Hx     Miscarriages / Stillbirths Neg Hx     Ovarian cancer Neg Hx     Preterm labor Neg Hx     Stroke Neg Hx        Social History:  Social History[5]     The following sections were reviewed this encounter by the provider:   Tobacco  Allergies  Meds  Problems  Med Hx  Surg Hx  Fam Hx         ROS:  General/Constitutional:   As above denies Chills.  Ophthalmologic:   Denies Blurred vision. Denies Eye Pain.   ENT:   As above.  Denies Sinus pain.   Endocrine:   Denies Polydipsia. Denies Polyuria.   Respiratory:   Denies Cough.. Denies Shortness of breath. Denies Wheezing.   Cardiovascular:   Denies Chest pain. . Denies Swelling in hands/feet.    Gastrointestinal:   Denies Abdominal pain.Denies Nausea. Denies Vomiting.   Genitourinary:   Denies Blood in urine. Denies Frequent urination. Denies Painful urination.   Musculoskeletal:    As above.  Denies Leg cramps. Denies Muscle aches.   Skin:    Denies skin rash  Neurologic:   Denies Dizziness.        Objective:   Vitals:  BP 121/81 (BP Site: Right arm, Patient Position: Sitting, Cuff Size: Medium)   Pulse (!) 56   Temp 98 F (36.7 C)   Resp 14   Wt 69.1 kg (152 lb 6.4 oz)   SpO2 99%   BMI 27.87 kg/m       Physical Exam:  General Examination:   GENERAL APPEARANCE: alert, in no acute distress, well developed, well nourished  oriented to time, place, and person.   H EENT: PERRL, EOMI, sclera anicteric.  Nasal mucosa edematous bilaterally, pale on right.  No sinus tenderness  Oropharynx: Mild erythema, no exudates  HEART:  S1, S2 normal,regular rate and rhythm.   LUNGS: normal effort / no distress, normal breath sounds, clear to auscultation  bilaterally, no wheezes, rales, rhonchi.   ABDOMEN:soft, +BS, NT/ND, no masses palpable  EXTREMITIES: No LE edema bilaterally.   NEURO:CN2-12 grossly intact, nonfocal  PSYCH: alert and oriented to time,place and person.   SKIN: moist, no focal rash    Assessment:       1. Sore throat  - POCT Rapid Group A Strep; Future  - POCT Rapid Group A Strep  - POCT Influenza A/B; Future  - POCT Influenza A/B  -Strep COVID and flu test negative  2. Rhinitis, unspecified type  - methylPREDNISolone (MEDROL DOSEPAK) 4 MG tablet; Follow Package Directions. TAKE WITH FOOD  Dispense: 21 tablet; Refill: 0  -Continue antihistamines  -Follow-up as needed if any worsening      Plan:   As above, follow-up as needed      Kela Millin, MD          [1]   Patient Active Problem List  Diagnosis    Seasonal allergies    History of palpitations    High risk medication use    Positive ANA (antinuclear antibody)    Chronic fatigue   [2]   No outpatient medications have been marked as taking  for the 03/29/23 encounter (Office Visit) with Kela Millin, MD.   [3]   Allergies  Allergen Reactions    Azithromycin Anaphylaxis    Phenytoin Anaphylaxis    Azo [Phenazopyridine]      rash    Magnesium Rash    Other Nausea And Vomiting and Headaches     Red Dye    Red Dye #40 (Allura Red) Nausea And Vomiting and Other (See Comments)   [4]   Past Medical History:  Diagnosis Date    Anti-NMDA receptor encephalitis 2014   [5]   Social History  Socioeconomic History    Marital status: Single   Tobacco Use    Smoking status: Never     Passive exposure: Never    Smokeless tobacco: Never   Vaping Use    Vaping status: Never Used   Substance and Sexual Activity    Alcohol use: Not Currently     Comment: socially    Drug use: Never    Sexual activity: Not Currently     Partners: Male     Birth control/protection: OCP     Social Determinants of Health     Financial Resource Strain: Low Risk  (02/19/2023)    Overall Financial Resource Strain (CARDIA)     Difficulty of Paying Living Expenses: Not hard at all   Food Insecurity: No Food Insecurity (02/19/2023)    Hunger Vital Sign     Worried About Running Out of Food in the Last Year: Never true     Ran Out of Food in the Last Year: Never true   Transportation Needs: No Transportation Needs (02/19/2023)    PRAPARE - Therapist, art (Medical): No     Lack of Transportation (Non-Medical): No   Physical Activity: Sufficiently Active (02/19/2023)    Exercise Vital Sign     Days of Exercise per Week: 4 days     Minutes of Exercise per Session: 50 min   Stress: Stress Concern Present (02/19/2023)    [employer] of Occupational Health - Occupational Stress Questionnaire     Feeling of Stress : To some extent   Social  Connections: Moderately Integrated (02/19/2023)    Social Connection and Isolation Panel [NHANES]     Frequency of Communication with Friends and Family: More than three times a week     Frequency of Social Gatherings with Friends and Family:  Three times a week     Attends Religious Services: More than 4 times per year     Active Member of Clubs or Organizations: Yes     Attends Banker Meetings: More than 4 times per year     Marital Status: Never married   Intimate Partner Violence: Not At Risk (02/19/2023)    Humiliation, Afraid, Rape, and Kick questionnaire     Fear of Current or Ex-Partner: No     Emotionally Abused: No     Physically Abused: No     Sexually Abused: No   Housing Stability: Low Risk  (02/19/2023)    Housing Stability Vital Sign     Unable to Pay for Housing in the Last Year: No     Number of Times Moved in the Last Year: 0     Homeless in the Last Year: No

## 2023-08-24 ENCOUNTER — Ambulatory Visit (INDEPENDENT_AMBULATORY_CARE_PROVIDER_SITE_OTHER): Payer: BLUE CROSS/BLUE SHIELD | Admitting: Family Medicine

## 2023-08-24 ENCOUNTER — Encounter (INDEPENDENT_AMBULATORY_CARE_PROVIDER_SITE_OTHER): Payer: Self-pay | Admitting: Family Medicine

## 2023-08-24 VITALS — BP 110/57 | HR 99 | Ht 62.0 in | Wt 145.0 lb

## 2023-08-24 DIAGNOSIS — R051 Acute cough: Secondary | ICD-10-CM

## 2023-08-24 DIAGNOSIS — J101 Influenza due to other identified influenza virus with other respiratory manifestations: Secondary | ICD-10-CM

## 2023-08-24 LAB — IHS AMB POCT SOFIA (TM) COVID-19 & FLU A/B
Sofia Influenza A Ag POCT: POSITIVE — AB
Sofia Influenza B Ag POCT: NEGATIVE
Sofia SARS COV2 Antigen POCT: NEGATIVE

## 2023-08-24 MED ORDER — OSELTAMIVIR PHOSPHATE 75 MG PO CAPS
75.0000 mg | ORAL_CAPSULE | Freq: Two times a day (BID) | ORAL | 0 refills | Status: AC
Start: 2023-08-24 — End: 2023-08-29

## 2023-08-24 NOTE — Progress Notes (Signed)
 Have you seen any specialists/other providers since your last visit with us ?    No    Health Maintenance Due   Topic Date Due    Tetanus Ten-Year  Never done    INFLUENZA VACCINE  Never done    DEPRESSION SCREENING  03/15/2023    COVID-19 Vaccine (2 - Novavax series) 03/19/2023

## 2023-08-24 NOTE — Patient Instructions (Signed)
 Thank you for visiting us  at [facility]!    For symptoms of Upper Respiratory Infection I recommend the following over-the-counter remedies:     Cough: please take over-the-counter Dextromethorphan (Delsyum or Robitussin)   Sore throat: cough drops such as Cepacol or Vicks brand, drink warm liquids such as soups. Warm salt water gargling up to 3 times a day can also help.   Sinus pressure, nasal congestion: You can try saline sinus rinses that are available over the counter or Andres Bangs MD sinus rinse - please follow instructions carefully and use safe water with the rinse. Using a steroid nasal spray such as over-the-counter Fluticasone can also help with decreasing inflammation and pressure in the nasal area  Use Tylenol  or Ibuprofen  for fever or body aches/ pain  Please get plenty of rest, drink plenty of fluids throughout the day and avoid dehydration. Practice proper hand washing techniques,     We hope you feel better soon! If your symptoms worsen or do not improve, please schedule a follow-up visit with our office.

## 2023-08-24 NOTE — Progress Notes (Signed)
 [facility] [place]                       Date of Exam: 08/24/2023 3:14 PM        Patient ID: Sarah Ball is a 28 y.o. female.  Attending Physician: Georg CHRISTELLA Hoard, DO        Chief Complaint:    Chief Complaint   Patient presents with    Fever    Cough    Chills        Subjective        HPI:    Sarah Ball is a 28 y.o. female presents to the clinic to discuss URI symptoms:     Onset of symptoms 2 days ago   Symptoms include:   Fever 100.5 yesterday, chills   [x]  productive cough   []  nasal congestion   []  sinus pressure   []  headache   []  sore throat  []  nausea/ vomiting   []  diarrhea   +ringing in the ears. No ear pain or hearing loss. No chest pain or shortness of breath   Able to eat and drink normal   No recent travel. Half of her choir is sick with respiratory symptoms   Patient is using the following medications for symptoms: Ibuprofen, Alkaseltzer cold/ flu           ROS:      ROS negative unless reported in HPI            Problem List:    Problem List[1]          Current Meds:    Medications Taking[2]       Allergies:    Allergies[3]            The following sections were reviewed this encounter by the provider:   Tobacco  Allergies  Meds  Problems  Med Hx  Surg Hx  Fam Hx              Objective    Vital Signs:    BP 110/57 (BP Site: Left arm, Patient Position: Sitting, Cuff Size: Medium)   Pulse 99   Ht 1.575 m (5' 2)   Wt 65.8 kg (145 lb)   SpO2 98%   BMI 26.52 kg/m          Physical Exam:    Physical Exam     General Appearance: Well appearing, well developed, well hydrated, good color, and in no acute distress  Head: Normocephalic atraumatic  Eyes: Pupils equal/round/reactive to light, no scleral icterus, no erythema, no discharge  Ears: Normal external shape, normal position, normal tympanic membranes, tympanic membranes flat, and normal landmarks  Nose: Nares patent and no discharge  Mouth: Moist mucous membranes, tongue normal, palate normal, no oropharyngeal  erythema  Lungs: CTA bilaterally, no wheezing, and good air entry  Heart: Regular rate and regular rhythm, no murmur  Musculoskeletal: Moves all 4 extremities  Neurologic: Alert/appropriate, normal strength, normal tone, clear speech  Skin: No rash to visible skin  Psych: Mood congruent affect, responds appropriately to questions.         Assessment & Plan    Assessment / Plan:    1. Influenza A  2. Acute cough  Flu A positive, COVID negative. 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 This was an allergy in childhood. She said it caused her to feel tingling on her tongue.  No swelling of the tongue or difficulty breathing. Shared decision making to continue to treat with Tamiflu  for influenza. ER precautions were advised to the patient if she had symptoms of allergic reaction to the Tamiflu . Patient verbalized understanding.     Anticipatory guidance and supportive care:  [x]  Recommend saline nasal rinse or steroid nasal spray (Flonase) 2 sprays in each nostril daily  [x]  Recommend over-the-counter Dextromethorphan (anti-tussive) medication PRN  [x]  Increase oral fluid intake  [x]  Can use acetaminophen or NSAIDs for fever / non-specific pain  [x]  Can take lozenge prn sore throat  [x]  Recommend warm salt water gargling up to 3x/ day, drink warm fluids such as soup/ tea  [x]  Rest, drink plenty of fluids and avoid dehydration. Recommend proper hand washing   - Sofia(TM) SARS COVID19 & Flu A/B POCT  - oseltamivir  (Tamiflu ) 75 MG capsule; Take 1 capsule (75 mg) by mouth 2 (two) times daily for 5 days  Dispense: 10 capsule; Refill: 0            Follow-up:    Follow-up if symptoms continue or worsen          Sarah Ball M Mykelle Cockerell, DO                   [1]   Patient Active Problem List  Diagnosis    Seasonal allergies    History of palpitations    High risk medication use    Positive ANA (antinuclear antibody)    Chronic fatigue   [2]   Outpatient Medications Marked as Taking for the 08/24/23  encounter (Office Visit) with Beth Goodlin M, DO   Medication Sig Dispense Refill    clobetasol  (TEMOVATE ) 0.05 % ointment Apply topically 2 (two) times daily 30 g 0    Ferrous Sulfate (IRON PO) Take 125 mg by mouth daily Slow release      fluticasone (FLONASE) 50 MCG/ACT nasal spray 1 spray by Nasal route daily      ipratropium (ATROVENT ) 0.03 % nasal spray SPRAY 2 SPRAYS BY NASAL ROUTE TWICE A DAY AS NEEDED FOR RHINITIS 30 mL 0    norethin  ace-eth estrad-FE (Blisovi  24 Fe) 1-20 MG-MCG(24) per tablet Take 1 tablet by mouth daily 112 tablet 3    nystatin  (MYCOSTATIN ) cream Apply topically as needed (vulvar itching) 30 g 1    Riboflavin  400 MG Cap Take 1 capsule (400 mg) by mouth daily 30 capsule 6    VITAMIN D  PO Take 2,000 IU by mouth every other day     [3]   Allergies  Allergen Reactions    Azithromycin Anaphylaxis    Phenytoin Anaphylaxis    Azo [Phenazopyridine]      rash    Magnesium Rash    Other Nausea And Vomiting and Headaches     Red Dye    Red Dye #40 (Allura Red) Nausea And Vomiting and Other (See Comments)

## 2024-02-07 ENCOUNTER — Encounter (HOSPITAL_BASED_OUTPATIENT_CLINIC_OR_DEPARTMENT_OTHER): Payer: Self-pay | Admitting: Nurse Practitioner

## 2024-02-07 MED ORDER — BLISOVI 24 FE 1-20 MG-MCG(24) PO TABS
1.0000 | ORAL_TABLET | Freq: Every day | ORAL | 0 refills | Status: DC
Start: 2024-02-07 — End: 2024-02-23

## 2024-02-07 NOTE — Progress Notes (Signed)
 Pt last OV: [employer] 02/20/2023 with Marcelline Huge, NP     Pt requesting courtesy [facility] refill.     Pt WW scheduled for 02/16/24 @3 :30pm with Marcelline Huge, NP.

## 2024-02-08 ENCOUNTER — Encounter (HOSPITAL_BASED_OUTPATIENT_CLINIC_OR_DEPARTMENT_OTHER): Payer: Self-pay

## 2024-02-08 NOTE — Telephone Encounter (Signed)
 Please sent a 1 month courtesy has annual on 02/20/24

## 2024-02-13 ENCOUNTER — Encounter (INDEPENDENT_AMBULATORY_CARE_PROVIDER_SITE_OTHER): Payer: Self-pay | Admitting: Family Medicine

## 2024-02-13 ENCOUNTER — Ambulatory Visit (INDEPENDENT_AMBULATORY_CARE_PROVIDER_SITE_OTHER): Payer: BLUE CROSS/BLUE SHIELD | Admitting: Family Medicine

## 2024-02-13 VITALS — BP 127/83 | HR 94 | Resp 16 | Ht 62.0 in | Wt 158.2 lb

## 2024-02-13 DIAGNOSIS — Z0000 Encounter for general adult medical examination without abnormal findings: Secondary | ICD-10-CM

## 2024-02-13 DIAGNOSIS — G8929 Other chronic pain: Secondary | ICD-10-CM

## 2024-02-13 DIAGNOSIS — M546 Pain in thoracic spine: Secondary | ICD-10-CM

## 2024-02-13 DIAGNOSIS — Z1331 Encounter for screening for depression: Secondary | ICD-10-CM

## 2024-02-13 DIAGNOSIS — R61 Generalized hyperhidrosis: Secondary | ICD-10-CM

## 2024-02-13 NOTE — Progress Notes (Unsigned)
 Have you seen any specialists/other providers since your last visit with us ?    No    Health Maintenance Due   Topic Date Due    Tetanus Ten-Year  Never done    COVID-19 Vaccine (2 - Novavax series) 03/19/2023    Pap Smear  01/13/2024

## 2024-02-13 NOTE — Progress Notes (Unsigned)
 [facility] [place]                       Date of Exam: 02/13/2024 5:13 PM        Patient ID: Sarah Ball is a 28 y.o. female.  Attending Physician: Georg CHRISTELLA Hoard, DO        Chief Complaint:    Chief Complaint   Patient presents with    Annual Exam     Not-fasting  IUD: oral contraception 02/09/24  Pap smear: 2022 - pt has OB appt    Left back pain     Night Sweats        Subjective        HPI:    Sarah Ball is a 28 y.o. female PMH environmental allergies who presents for her annual physical exam.    Reported Health: good health  Interval Events: none    Patient is:   Menstrual hx: LMP: 02/09/24  Relationship status: Single  STD concerns: denies knowledge of risky exposure  Contraception: OCP     Health Screenings:   >Cervical Cancer Screening: Due. last pap smear 2022 NILM. Follows w/ OBGYN    Vision: No concerns   Hearing: No concerns  Dental: Visits with dentist 1-2x/ year    Lifestyle:  Work Status: working full-time  Diet: Vegetarian   Exercise: 4 days/ week - pilates   Mood: is ok     Other concerns addressed:     Left upper back pain: started in January this year. No injury to the area. Symptoms come and goes. Describes the pain as sharp. Worse when reaching overhead, certain movements of the left arm, and carrying objects. She has not tried any medications.     She reports symptoms of night sweats - drenched in sweat around the time of her period. No fevers or unintentional weight loss. Sometimes has lymph nodes in the axillary region but they go away on their own. Symptoms have been present for 1 year. Her periods are regular.               Problem List:    Problem List[1]          Current Meds:    Medications Taking[2]       Allergies:    Allergies[3]          Past Surgical History:    Past Surgical History[4]        Family History:    Family History[5]            The following sections were reviewed this encounter by the provider:   Tobacco  Allergies  Meds  Problems   Med Hx  Surg Hx  Fam Hx  Soc Hx              ROS:    Negative unless reported in HPI       Objective    Vital Signs:    BP 127/83 (BP Site: Left arm, Patient Position: Sitting, Cuff Size: Small)   Pulse 94   Resp 16   Ht 1.575 m (5' 2)   Wt 71.8 kg (158 lb 3.2 oz)   SpO2 98%   BMI 28.94 kg/m              Physical Exam:    Physical Exam     General Appearance: Well appearing, well developed, well hydrated, good color, and in no acute distress  Head: Normocephalic atraumatic  Eyes: Pupils equal/round/reactive to light, no scleral icterus, no erythema, no discharge  Ears: Normal external shape, normal position, normal tympanic membranes, tympanic membranes flat, and normal landmarks  Nose: Nares patent and no discharge  Mouth: Moist mucous membranes, tongue normal, palate normal, no oropharyngeal erythema  Neck: Supple, no cervical lymphadenopathy  Lungs: CTA bilaterally, no wheezing, and good air entry  Heart: Regular rate and regular rhythm, no murmur  Abdomen: soft, non-tender, non-distended  Musculoskeletal: Moves all 4 extremities. Tenderness to palpation left T2-T3 paraspinal muscles. No midline tenderness. Normal ROM of left shoulder, negative empty can test, negative lift off test and cross arm test   Extremities: Symmetric, no edema   Neurologic: Alert/appropriate, normal strength, normal tone, clear speech  Skin: No rash to visible skin  Psych: Mood congruent affect, responds appropriately to questions.           Assessment & Plan     Assessment / Plan:    1. Encounter for wellness examination  2. Screening for depression  Based on reviewed health maintenance status/history, patient is due for:    Screenings:  BP WNL today  Diabetes: A1c ordered  Hyperlipidemia: Lipid panel ordered  STD screening: Not indicated   Depression screen: I reviewed the PHQ2 questionnaire. PHQ2 scoring:0  Negative  Pap smear: Due. Follows w/ OBGYN    Based on reviewed immunization status, patient is due for:  Recommend  Flu vaccine this fall     - CBC with Differential (Order); Future  - Comprehensive Metabolic Panel; Future  - Follow Up In Primary Care; Future  - Hemoglobin A1C; Future  - Lipid Panel; Future  - Thyroid  Stimulating Hormone (TSH) with Reflex to Free T4; Future    3. Chronic left-sided thoracic back pain  Likely MSK related  Recommend home stretching exercises, referral to PT provided  NSAIDs PRN   - Referral to Physical Therapy - EXTERNAL; Future    4. Night sweats  Unclear etiology   Medication list reviewed   No fevers/ chills or symptoms related to infectious etiology. Will check CBC.  No abnormal weight loss or lymphadenopathy, no hx of DM. Will check thyroid  labs  Menstrual cycle is regular   Labs ordered as below:   - Thyroid  Stimulating Hormone (TSH) with Reflex to Free T4; Future  - CBC with Differential (Order); Future  - Comprehensive Metabolic Panel; Future  - Hemoglobin A1C; Future    Health Maintenance:  Recommend optimizing low carbohydrate diet efforts and obtaining at least 150 minutes of aerobic exercise per week. Recommend 20-25 grams of dietary fiber daily. Recommend drinking at least 60-80 ounces of water per day. Recommend optimizing low sodium diet measures ( less than 2 grams of sodium in the diet per day ).  Counseling/Anticipatory Guidance: nutrition, physical activity, healthy weight, injury prevention, misuse of tobacco, alcohol and drugs, sexual behavior and STDs, dental health, mental health, immunizations.               Follow-up:    Follow up in 1 year for annual physical       Yassin Scales M Colton Engdahl, DO                [1]   Patient Active Problem List  Diagnosis    Seasonal allergies    History of palpitations    High risk medication use    Positive ANA (antinuclear antibody)   [2]   Outpatient Medications Marked as Taking for the 02/13/24 encounter (Office Visit) with Aveen Stansel,  Naquisha Whitehair M, DO   Medication Sig Dispense Refill    clobetasol  (TEMOVATE ) 0.05 % ointment Apply topically 2 (two) times  daily 30 g 0    EPINEPHrine  0.3 MG/0.3ML Solution Auto-injector injection Inject 0.3 mLs (0.3 mg) into the muscle once for 1 dose 0.3 mL 0    Ferrous Sulfate (IRON PO) Take 125 mg by mouth daily Slow release      fluticasone (FLONASE) 50 MCG/ACT nasal spray 1 spray by Nasal route daily      ipratropium (ATROVENT ) 0.03 % nasal spray SPRAY 2 SPRAYS BY NASAL ROUTE TWICE A DAY AS NEEDED FOR RHINITIS 30 mL 0    norethin  ace-eth estrad-FE (Blisovi  24 Fe) 1-20 MG-MCG(24) per tablet Take 1 tablet by mouth once daily 30 tablet 0    nystatin  (MYCOSTATIN ) cream Apply topically as needed (vulvar itching) 30 g 1    Riboflavin  400 MG Cap Take 1 capsule (400 mg) by mouth daily 30 capsule 6    VITAMIN D  PO Take 2,000 IU by mouth every other day     [3]   Allergies  Allergen Reactions    Azithromycin Anaphylaxis    Phenytoin Anaphylaxis    Azo [Phenazopyridine]      rash    Dust Mite Extract Cough and Rash    Magnesium Rash    Other Nausea And Vomiting and Headaches     Red Dye    Red Dye #40 (Allura Red) Nausea And Vomiting and Other (See Comments)   [4]   Past Surgical History:  Procedure Laterality Date    TONSILLECTOMY     [5]   Family History  Problem Relation Name Age of Onset    Hypertension Mother Kassady Laboy     Arthritis Mother Brooklynn Brandenburg     No known problems Father      Hypertension Maternal Grandmother Jaretzi Droz     Breast cancer Maternal Jeannine Elyn Blasdell 70    Heart failure Maternal Grandmother Elyn Elvis     Hyperlipidemia Maternal Grandmother Elyn Elvis         After breast cancer mastectomy    Cancer Maternal Grandmother Elyn Elvis     Arthritis Maternal Grandmother Elyn Elvis     Anesthesia problems Maternal Grandmother Gweneth Fredlund     Heart disease Maternal Grandmother Elyn Elvis     Gout Maternal Grandmother Camera operator     Thyroid  disease Maternal Grandmother Der Gagliano     Hypertension Maternal Grandfather Elsie Elvis I     Prostate cancer Maternal Grandfather Elsie Elvis I     Stroke Maternal Grandfather Elsie Elvis I     Cancer Maternal Grandfather Elsie Elvis I     Hypertension Maternal Uncle William Perusse II     Colon cancer Neg Hx      Diabetes Neg Hx      Eclampsia Neg Hx      Miscarriages / Stillbirths Neg Hx      Ovarian cancer Neg Hx      Preterm labor Neg Hx

## 2024-02-14 NOTE — Patient Instructions (Signed)
 Understanding the Mediterranean Diet  A Mediterranean-style diet is a healthy way of eating, not a specific diet to lose weight. It includes a lot of foods from plants such as vegetables, fruits, and whole grains, plus olive oil and seafood. It also includes dairy foods and meats, but in smaller amounts. And it includes a moderate amount of wine. Many studies over time have shown health benefits to eating this way. It focuses on making fresh food that's full of flavor.  This plan of eating is inspired by how people eat in countries around the [employer]. They include [place], [place], [place], [place], [place], [place]. But it uses foods you can buy in almost any grocery store.  Health benefits of a Mediterranean diet  This diet is high in fiber, lean protein, and healthy oils. It's low in saturated fats and sugar. The diet has been shown to help prevent ormanage:  Depression  Diabetes  Heart disease  High blood pressure  Parkinson disease  Alzheimer disease  Cancers of the colon, prostate, and breast  What do I eat on a Mediterranean diet?  Plan each meal around vegetables and whole grains. Use olive oil. Add nuts andlegumes. Include fish or lean protein. Foods to focus your meals around include:  Food type What to eat   Vegetables This includes leafy greens, tomatoes, squash, peppers, cucumbers, green beans, eggplant, avocados, potatoes, and olives. You can use fresh or frozenvegetables.   Fruits This includes apples, raspberries, strawberries, grapes, citrus fruit such as oranges and grapefruit, stone fruit such as apricots and peaches, plus figs,dates, and melon.   Whole grains This includes brown rice, whole oats, quinoa, millet, whole grain bread,whole-wheat pasta, and crackers made with whole grains.   Beans and legumes These include lentils, chickpeas, and beans such as pinto, fava, kidney, andblack beans. Peanuts are also legumes.   Seafood This includes fish such as salmon, trout, mackerel, haddock,  tuna, sardines, anchovies, and whitefish. It also includes shellfish such as shrimp, oysters,mussels, and clams.   Nuts and seeds These include walnuts, almonds, sunflower seeds, cashews, [place] nuts, andpecans.   Healthy oil Olive oil is the most common oil in the Mediterranean diet. But other healthyoils are canola, sunflower, safflower, and corn oils.   Herbs and spices Season food with oregano, pepper, sage, tarragon, thyme, basil, cinnamon, andcumin.   Wine Enjoy a glass of wine each day with a meal. Skip this if you need to not havealcohol for any reason.   Foods to eat in smaller amounts  You can add these foods in a few days a week, in smaller amounts:  Dairy foods, such as cheese, yogurt, and butter  Poultry, such as chicken and duck  Eggs  Occasional treats  Limit these foods in your weekly eating plan:  Red meats, such as beef, lamb, and pork  Refined grains, such as white rice and foods made with white flour  Sugary treats, such as chocolate, candy, or pastries  Adding lots of flavor  You can liven up fresh foods with many kinds of flavor. Try these sauces, dips,and seasonings:  Hummus  Marinara sauce  Salsa  Vinaigrette dressing  Tips for eating out  At restaurants:  Skip fried foods. These have a lot of saturated fat.  Look for fish dishes that are cooked without cream or butter.  Pick salads that have nuts and seeds.  Choose vegetarian options that don't have too much cheese.  [employer] last reviewed this educational content on5/07/2018

## 2024-02-16 ENCOUNTER — Ambulatory Visit (HOSPITAL_BASED_OUTPATIENT_CLINIC_OR_DEPARTMENT_OTHER): Payer: BLUE CROSS/BLUE SHIELD | Admitting: Nurse Practitioner

## 2024-02-16 ENCOUNTER — Other Ambulatory Visit (FREE_STANDING_LABORATORY_FACILITY): Payer: BLUE CROSS/BLUE SHIELD

## 2024-02-16 DIAGNOSIS — Z0000 Encounter for general adult medical examination without abnormal findings: Secondary | ICD-10-CM

## 2024-02-16 DIAGNOSIS — R61 Generalized hyperhidrosis: Secondary | ICD-10-CM

## 2024-02-16 LAB — LAB USE ONLY - CBC WITH DIFFERENTIAL
Absolute Basophils: 0.02 x10 3/uL (ref 0.00–0.08)
Absolute Eosinophils: 0.03 x10 3/uL (ref 0.00–0.44)
Absolute Immature Granulocytes: 0.01 x10 3/uL (ref 0.00–0.07)
Absolute Lymphocytes: 2.24 x10 3/uL (ref 0.42–3.22)
Absolute Monocytes: 0.34 x10 3/uL (ref 0.21–0.85)
Absolute Neutrophils: 3.46 x10 3/uL (ref 1.10–6.33)
Absolute nRBC: 0 x10 3/uL (ref <=0.00)
Basophils %: 0.3 %
Eosinophils %: 0.5 %
Hematocrit: 37.8 % (ref 34.7–43.7)
Hemoglobin: 12.1 g/dL (ref 11.4–14.8)
Immature Granulocytes %: 0.2 %
Lymphocytes %: 36.7 %
MCH: 26.5 pg (ref 25.1–33.5)
MCHC: 32 g/dL (ref 31.5–35.8)
MCV: 82.9 fL (ref 78.0–96.0)
MPV: 10.7 fL (ref 8.9–12.5)
Monocytes %: 5.6 %
Neutrophils %: 56.7 %
Platelet Count: 385 x10 3/uL — ABNORMAL HIGH (ref 142–346)
Preliminary Absolute Neutrophil Count: 3.46 x10 3/uL (ref 1.10–6.33)
RBC: 4.56 x10 6/uL (ref 3.90–5.10)
RDW: 14 % (ref 11–15)
WBC: 6.1 x10 3/uL (ref 3.10–9.50)
nRBC %: 0 /100{WBCs} (ref <=0.0)

## 2024-02-16 LAB — THYROID STIMULATING HORMONE (TSH) WITH REFLEX TO FREE T4: TSH: 0.6 u[IU]/mL (ref 0.35–4.94)

## 2024-02-16 LAB — COMPREHENSIVE METABOLIC PANEL
ALT: 19 U/L (ref <=55)
AST (SGOT): 17 U/L (ref <=41)
Albumin/Globulin Ratio: 1.3 (ref 0.9–2.2)
Albumin: 3.9 g/dL (ref 3.5–5.0)
Alkaline Phosphatase: 51 U/L (ref 37–117)
Anion Gap: 8 (ref 5.0–15.0)
BUN: 9 mg/dL (ref 7–21)
Bilirubin, Total: 1 mg/dL (ref 0.2–1.2)
CO2: 25 meq/L (ref 17–29)
Calcium: 9.3 mg/dL (ref 8.5–10.5)
Chloride: 107 meq/L (ref 99–111)
Creatinine: 0.9 mg/dL (ref 0.4–1.0)
GFR: >60 mL/min/1.73 m2 (ref >=60.0)
Globulin: 3.1 g/dL (ref 2.0–3.6)
Glucose: 88 mg/dL (ref 70–100)
Hemolysis Index: 4 {index}
Potassium: 4.4 meq/L (ref 3.5–5.3)
Protein, Total: 7 g/dL (ref 6.0–8.3)
Sodium: 140 meq/L (ref 135–145)

## 2024-02-16 LAB — LIPID PANEL
Cholesterol / HDL Ratio: 2.5 {index}
Cholesterol: 159 mg/dL (ref <=199)
HDL: 63 mg/dL (ref >=40)
LDL Calculated: 83 mg/dL (ref 0–99)
Triglycerides: 66 mg/dL (ref 34–149)
VLDL Calculated: 13 mg/dL (ref 10–40)

## 2024-02-16 LAB — HEMOGLOBIN A1C
Average Estimated Glucose: 111.2 mg/dL
Hemoglobin A1C: 5.5 % (ref 4.6–5.6)

## 2024-02-20 ENCOUNTER — Ambulatory Visit (HOSPITAL_BASED_OUTPATIENT_CLINIC_OR_DEPARTMENT_OTHER): Payer: BLUE CROSS/BLUE SHIELD | Admitting: Nurse Practitioner

## 2024-02-21 ENCOUNTER — Ambulatory Visit (INDEPENDENT_AMBULATORY_CARE_PROVIDER_SITE_OTHER): Payer: Self-pay | Admitting: Family Medicine

## 2024-02-23 ENCOUNTER — Ambulatory Visit: Payer: BLUE CROSS/BLUE SHIELD | Attending: Nurse Practitioner | Admitting: Nurse Practitioner

## 2024-02-23 ENCOUNTER — Encounter (HOSPITAL_BASED_OUTPATIENT_CLINIC_OR_DEPARTMENT_OTHER): Payer: Self-pay | Admitting: Nurse Practitioner

## 2024-02-23 ENCOUNTER — Ambulatory Visit (INDEPENDENT_AMBULATORY_CARE_PROVIDER_SITE_OTHER): Payer: BLUE CROSS/BLUE SHIELD | Admitting: Nurse Practitioner

## 2024-02-23 VITALS — BP 130/84 | HR 106 | Temp 98.3°F | Ht 62.0 in | Wt 157.0 lb

## 2024-02-23 DIAGNOSIS — Z01419 Encounter for gynecological examination (general) (routine) without abnormal findings: Secondary | ICD-10-CM

## 2024-02-23 DIAGNOSIS — Z3041 Encounter for surveillance of contraceptive pills: Secondary | ICD-10-CM

## 2024-02-23 MED ORDER — BLISOVI 24 FE 1-20 MG-MCG(24) PO TABS
1.0000 | ORAL_TABLET | Freq: Every day | ORAL | 3 refills | Status: AC
Start: 2024-02-23 — End: ?

## 2024-02-23 NOTE — Progress Notes (Signed)
 CC/HPI    Sarah Ball is a 28 y.o. female, established patient, here today for annual gyn exam.    Reports she tried taking her OCPs continuously x 3 months last year, but didn't notice a decrease in the night sweats.  Reports they aren't as frequent as last year, but are still occurring.  Recent TSH thru PCP returned 0.60.  Reports on the continuous pills she didn't notice a pattern to the nights sweats, but does notice it before menses when taking placebo pills.    GYN Hx   Sexually Active: yes  Birth Control: Blisovi  24  Menses: 13/4-5/monthly    Last pap: 12/2020, negative  Hx abnormal paps: denies  Colposcopy:  N/A  Hx STDs:  denies    OB Hx  [id number]    Allergies  Allergies[1]    Medications  Current Medications[2]    PMH  Medical History[3]        PSH  Past Surgical History[4]    SH  Social History[5]    FH  Family History[6]    VITAL SIGNS  BP 130/84   Pulse (!) 106   Temp 98.3 F (36.8 C)   Ht 5' 2 (1.575 m)   Wt 157 lb (71.2 kg)   LMP 02/14/2024 (Approximate)   BMI 28.72 kg/m     EXAM  GENERAL APPEARANCE    Alert, Well appearing, Oriented x 3, In no distress    BREAST  Sexual Maturity:  Tanner 5  Nipples:  Left - normal, no discharge; Right - normal, no discharge  Breast:  symmetrical; Left - normal, non-tender, no mass; Right - normal, non-tender, no mass    PELVIC  Vulva:  normal, atrophic, no lesions, normal clitorus  Perineum:  intact  Introitus:  normal, atrophic, no lesions, no discharge  Urethra:  normal  Vagina:  normal, atrophic, no lesions, no discharge, no masses, no tenderness  Cervix:  surgically absent  Uterus:  surgically absent  Adnexa:  Left - normal, non-tender, not enlarged; Right - normal, non-tender, not enlarged  Rectal:  deferred    ASSESSMENT  1. Encounter for gynecological examination (general) (routine) without abnormal findings    2. Surveillance for birth control, oral contraceptives        PLAN  Orders Placed This Encounter   Medications    norethin  ace-eth  estrad-FE (Blisovi  24 Fe) 1-20 MG-MCG(24) per tablet     Sig: Take 1 tablet by mouth once daily     Dispense:  84 tablet     Refill:  3      Pt aware normal results will be sent via My Chart.  Pap today.  Reviewed again that I don't think night sweats are related to OCPs.  Reviewed stopping OCPs to see if they improve, but pt would like to stay on them for now.    Encouraged to discuss night sweats further with PCP.    Barnett Elzey L. Merisa Julio, MS, RN, WHNP-BC         [1]   Allergies  Allergen Reactions    Azithromycin Anaphylaxis    Phenytoin Anaphylaxis    Azo [Phenazopyridine]      rash    Dust Mite Extract Cough and Rash    Magnesium Rash    Other Nausea And Vomiting and Headaches     Red Dye    Red Dye #40 (Allura Red) Nausea And Vomiting and Other (See Comments)   [2]   Current Outpatient Medications   Medication Sig Dispense  Refill    clobetasol  (TEMOVATE ) 0.05 % ointment Apply topically 2 (two) times daily 30 g 0    EPINEPHrine  0.3 MG/0.3ML Solution Auto-injector injection Inject 0.3 mLs (0.3 mg) into the muscle once for 1 dose 0.3 mL 0    Ferrous Sulfate (IRON PO) Take 125 mg by mouth daily Slow release      fluticasone (FLONASE) 50 MCG/ACT nasal spray 1 spray by Nasal route daily      ipratropium (ATROVENT ) 0.03 % nasal spray SPRAY 2 SPRAYS BY NASAL ROUTE TWICE A DAY AS NEEDED FOR RHINITIS 30 mL 0    nystatin  (MYCOSTATIN ) cream Apply topically as needed (vulvar itching) 30 g 1    Riboflavin  400 MG Cap Take 1 capsule (400 mg) by mouth daily 30 capsule 6    VITAMIN D  PO Take 2,000 IU by mouth every other day      norethin  ace-eth estrad-FE (Blisovi  24 Fe) 1-20 MG-MCG(24) per tablet Take 1 tablet by mouth once daily 84 tablet 3     No current facility-administered medications for this visit.   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    Headache     Numbness     Experienced in fingertips + toes, currently on and off on tongue and bottom lip    Seasonal allergic rhinitis 2021    Seasonal Allergies    Weakness    [4]   Past Surgical History:  Procedure Laterality Date    TONSILLECTOMY     [5]   Social History  Socioeconomic History    Marital status: Single   Tobacco Use    Smoking status: Never     Passive exposure: Never    Smokeless tobacco: Never   Vaping Use    Vaping status: Never Used   Substance and Sexual Activity    Alcohol use: Yes     Alcohol/week: 0.0 - 1.0 standard drinks of alcohol     Comment: socially    Drug use: Never    Sexual activity: Yes     Partners: Male     Birth control/protection: OCP     Social Drivers of Psychologist, prison and probation services Strain: Low Risk  (02/23/2024)    Overall Financial Resource Strain (CARDIA)     Difficulty of Paying Living Expenses: Not hard at all   Food Insecurity: No Food Insecurity (02/23/2024)    Hunger Vital Sign     Worried About Running Out of Food in the Last Year: Never true     Ran Out of Food in the Last Year: Never true   Transportation Needs: No Transportation Needs (02/23/2024)    PRAPARE - Therapist, art (Medical): No     Lack of Transportation (Non-Medical): No   Physical Activity: Sufficiently Active (02/23/2024)    Exercise Vital Sign     Days of Exercise per Week: 4 days     Minutes of Exercise per Session: 50 min   Stress: Stress Concern Present (02/23/2024)    [employer] of Occupational Health - Occupational Stress Questionnaire     Feeling of Stress : To some extent   Social Connections: Moderately Integrated (02/19/2023)    Social Connection and Isolation Panel  Frequency of Communication with Friends and Family: More than three times a week     Frequency of Social Gatherings with Friends and Family: Three times a week     Attends Religious Services: More than 4 times per year     Active Member of Clubs or Organizations: Yes     Attends Tax inspector Meetings: More than 4 times per year     Marital Status: Never married   Intimate Partner Violence: Not At Risk (02/23/2024)    Humiliation, Afraid, Rape, and Kick questionnaire     Fear of Current or Ex-Partner: No     Emotionally Abused: No     Physically Abused: No     Sexually Abused: No   Housing Stability: Not At Risk (02/23/2024)    Housing Stability NCSS     Do you have housing?: Yes     Are you worried about losing your housing?: No   [6]   Family History  Problem Relation Name Age of Onset    Hypertension Mother Kadi Hession     Arthritis Mother Asjah Rauda     No known problems Father      Hypertension Maternal Grandmother Brannon Decaire     Breast cancer Maternal Jeannine Elyn Keesling 70    Heart failure Maternal Grandmother Elyn Elvis     Hyperlipidemia Maternal Grandmother Elyn Elvis         After breast cancer mastectomy    Arthritis Maternal Grandmother Elyn Elvis     Anesthesia problems Maternal Grandmother Camay Pedigo     Heart disease Maternal Grandmother Elyn Elvis     Gout Maternal Grandmother Camera operator     Thyroid  disease Maternal Grandmother Naeema Patlan     Hypertension Maternal Grandfather Elsie Elvis I     Prostate cancer Maternal Grandfather Elsie Elvis I     Stroke Maternal Grandfather Sharae Zappulla I     Hypertension Maternal Uncle William Bernstein II     Colon cancer Neg Hx      Diabetes Neg Hx      Eclampsia Neg Hx      Miscarriages / Stillbirths Neg Hx      Ovarian cancer Neg Hx      Preterm labor Neg Hx

## 2024-03-01 LAB — LAB USE ONLY- GYN CYTOLOGY/PAP SMEAR

## 2024-03-04 ENCOUNTER — Ambulatory Visit (HOSPITAL_BASED_OUTPATIENT_CLINIC_OR_DEPARTMENT_OTHER): Payer: Self-pay | Admitting: Nurse Practitioner

## 2024-04-19 ENCOUNTER — Other Ambulatory Visit (INDEPENDENT_AMBULATORY_CARE_PROVIDER_SITE_OTHER): Payer: Self-pay | Admitting: Family Medicine

## 2024-04-30 ENCOUNTER — Other Ambulatory Visit (HOSPITAL_BASED_OUTPATIENT_CLINIC_OR_DEPARTMENT_OTHER): Payer: Self-pay | Admitting: Nurse Practitioner

## 2024-05-02 ENCOUNTER — Encounter (INDEPENDENT_AMBULATORY_CARE_PROVIDER_SITE_OTHER): Payer: Self-pay | Admitting: Family Medicine

## 2024-05-16 ENCOUNTER — Encounter (INDEPENDENT_AMBULATORY_CARE_PROVIDER_SITE_OTHER): Payer: Self-pay | Admitting: Family Medicine

## 2024-05-16 ENCOUNTER — Telehealth (INDEPENDENT_AMBULATORY_CARE_PROVIDER_SITE_OTHER): Payer: BLUE CROSS/BLUE SHIELD | Admitting: Family Medicine

## 2024-05-16 DIAGNOSIS — R61 Generalized hyperhidrosis: Secondary | ICD-10-CM

## 2024-05-16 DIAGNOSIS — R232 Flushing: Secondary | ICD-10-CM

## 2024-05-16 NOTE — Progress Notes (Signed)
 [facility] [place]                  TELEMEDICINE (VIDEO) VISIT    Documentation Requirements    Telehealth Visit  Provider and Title: Louvenia Golomb, DO  The Patient has given verbal consent for delivery of health care via telehealth (video visit encounter)   The patient is located in [place]    The encounter provider is located at their Medical Office in [place]   [employer]pic Video Client was utilized for Real Time/Synchronous Telehealth.   Language, if applicable and if translator was required: English. No translator required          Date of Exam: 05/16/2024 3:02 PM        Patient ID: Keyia Moretto is a 28 y.o. female.  Attending Physician: Georg CHRISTELLA Hoard, DO        Chief Complaint:    Chief Complaint   Patient presents with    Night Sweats    Hot Flashes             HPI:    Shalah Estelle is a 28 y.o. female presents for a virtual visit to discuss the following:     Hot flashes/ night sweats:   Symptoms started 1.5 year ago   Primarily occur in the night - wakes up in the middle of the night drenched in sweat requiring her to change her bed sheets /clothes    Symptoms occur about twice a week - notices this occurs more frequently leading up to her menstrual cycles.   She has also noticed daytime symptoms which she describes as hot flashes  Her periods are regular occur once a month. She is taking COCPs. LMP: 05/03/24. Her period lasts 4-5 days   She denies any symptoms of fever, lymphadenopathy, wheezing, diarrhea, anxiety or palpitations. No unintentional weight loss. No immunosuppression. No recent travel. No hx of IVDU or endocarditis. No recent exposure to ticks or mosquito   No hx of HTN  No recent known COVID infections.   No hx of DM. No skin changes/ hyperpigmentation   Of note, has a history of positive ANA - visited w/ rheumatology. Negative ANA panel     She has discussed these symptoms with her OBGYN tried continuous OCPs which did not make a difference in night sweats.     ROS  negative unless reported in HPI       Problem List, Medications, and Allergies reviewed: YES             Physical Exam:    Self-reported Vitals: none reported     Pulmonary/Chest: Effort normal. No respiratory distress.   Neurological: patient  is alert.   Psychiatric: patient has a normal mood and affect. Patient's behavior is normal. Judgment and thought content normal.            Assessment / Plan:    1. Night sweats  2. Hot flashes  Unclear etiology. Chronic condition progressing/ occurring more frequently now (night and day symptoms)  Reviewed previous lab results, medication list, consultant notes (OBGYN). Per OBGYN notes, less concern this is due to OCPs  No fevers/ chills or symptoms related to infectious etiology. CBC 02/16/24 normal   No abnormal weight loss or lymphadenopathy, no hx of DM or thyroid  conditions.   Menstrual cycle is regular   Will further evaluate labs as below     - Follicle Stimulating Hormone; Future  - Cortisol; Future  - HIV-1/2, Antigen and Antibody with  Reflex to Confirmation; Future  - Sedimentation Rate (ESR); Future  - C Reactive Protein; Future           Total time spent performing activities on date of service:  29  minutes           Follow-up:    Follow up in 1 month to discuss lab results        Analyce Tavares M [place] Decoteau, DO

## 2024-05-16 NOTE — Progress Notes (Signed)
 Have you seen any specialist/other providers since your last visit with us ?  No    Are you located in the state of [place] ?  Yes    Do you consent to this telemedicine visit?  Yes      Health Maintenance Due   Topic Date Due    Influenza Vaccine  Never done

## 2024-05-19 ENCOUNTER — Encounter (INDEPENDENT_AMBULATORY_CARE_PROVIDER_SITE_OTHER): Payer: Self-pay | Admitting: Family Medicine

## 2024-05-21 ENCOUNTER — Telehealth (INDEPENDENT_AMBULATORY_CARE_PROVIDER_SITE_OTHER): Payer: Self-pay

## 2024-05-21 NOTE — Telephone Encounter (Signed)
 Patient called here:  She had scoliosis as a child and has not been treated since   She is starting to have back pain now.  Also, she had c-spine MRI in chart from last year, but she states that was done for her migraines.   Advised I will check with Dr Dierdre for possible scheduling  Message sent to Dr Dierdre.

## 2024-05-29 ENCOUNTER — Ambulatory Visit: Payer: BLUE CROSS/BLUE SHIELD

## 2024-05-29 NOTE — Progress Notes (Signed)
 05/29/24 1000   Pain History   Pain Symptoms Pain   Pain Location Mid Back;Lower Back   Pain Description Sharp/Stabbing;Dull/Aching   Pain Frequency Constant   Sensation Location Lower Back   Sensation Description Pins and Needles   Sensation Frequency Intermittent   Foot Drop No   Incontinence No   Treatments   Have you visited a chiropractor for this problem? No   Have you ever had physical therapy for this problem? No   Have you ever received an injection for this problem? No   Care Management   Have you had any prior spine surgeries? No   ISP Appointment   ISP Physician Sarah Ball   ISP Appointment Date 06/07/24  (1:00pm)   ISP Appointment Location [facility]

## 2024-05-30 ENCOUNTER — Encounter (INDEPENDENT_AMBULATORY_CARE_PROVIDER_SITE_OTHER): Payer: Self-pay | Admitting: Family Medicine

## 2024-06-03 ENCOUNTER — Other Ambulatory Visit: Payer: BLUE CROSS/BLUE SHIELD

## 2024-06-03 DIAGNOSIS — R232 Flushing: Secondary | ICD-10-CM

## 2024-06-03 DIAGNOSIS — R61 Generalized hyperhidrosis: Secondary | ICD-10-CM

## 2024-06-03 LAB — HIV-1/2, ANTIGEN AND ANTIBODY WITH REFLEX TO CONFIRMATION: HIV Ag/Ab 4th Generation: NONREACTIVE

## 2024-06-03 LAB — CORTISOL: Cortisol: 26.9 ug/dL

## 2024-06-03 LAB — C-REACTIVE PROTEIN: C-Reactive Protein: 0.3 mg/dL (ref 0.0–1.1)

## 2024-06-03 LAB — LAB USE ONLY - LAVENDER - EDTA HOLD TUBE

## 2024-06-03 LAB — SEDIMENTATION RATE: Sed Rate: 6 mm/h (ref <=20)

## 2024-06-03 LAB — FOLLICLE STIMULATING HORMONE: Follicle Stimulating Hormone: 11.49 m[IU]/mL

## 2024-06-03 LAB — LAB USE ONLY - GOLD SST HOLD TUBE

## 2024-06-06 ENCOUNTER — Ambulatory Visit (INDEPENDENT_AMBULATORY_CARE_PROVIDER_SITE_OTHER): Payer: Self-pay | Admitting: Family Medicine

## 2024-06-06 DIAGNOSIS — E270 Other adrenocortical overactivity: Secondary | ICD-10-CM

## 2024-06-06 DIAGNOSIS — R61 Generalized hyperhidrosis: Secondary | ICD-10-CM

## 2024-06-06 DIAGNOSIS — R635 Abnormal weight gain: Secondary | ICD-10-CM

## 2024-06-07 ENCOUNTER — Ambulatory Visit: Payer: BLUE CROSS/BLUE SHIELD | Admitting: Family Nurse Practitioner

## 2024-06-11 NOTE — Telephone Encounter (Signed)
 Elevated cortisol level, symptoms of weight gain, night sweats. Referral to Endo

## 2024-06-21 ENCOUNTER — Encounter: Payer: Self-pay | Admitting: Family Nurse Practitioner

## 2024-06-21 ENCOUNTER — Ambulatory Visit
Admission: RE | Admit: 2024-06-21 | Discharge: 2024-06-21 | Disposition: A | Discharge status: Home / Self Care | Payer: BLUE CROSS/BLUE SHIELD | Source: Ambulatory Visit | Attending: Family Nurse Practitioner | Admitting: Family Nurse Practitioner

## 2024-06-21 ENCOUNTER — Ambulatory Visit: Payer: BLUE CROSS/BLUE SHIELD | Attending: Neurological Surgery | Admitting: Family Nurse Practitioner

## 2024-06-21 VITALS — Ht 62.0 in | Wt 157.0 lb

## 2024-06-21 DIAGNOSIS — M545 Low back pain, unspecified: Secondary | ICD-10-CM

## 2024-06-21 DIAGNOSIS — M41115 Juvenile idiopathic scoliosis, thoracolumbar region: Secondary | ICD-10-CM

## 2024-06-21 DIAGNOSIS — G8929 Other chronic pain: Secondary | ICD-10-CM

## 2024-06-21 MED ORDER — METHOCARBAMOL 500 MG PO TABS: 500.0000 mg | ORAL_TABLET | Freq: Four times a day (QID) | ORAL | 0 refills | Status: AC

## 2024-06-21 NOTE — Progress Notes (Signed)
 [facility]  New Patient Note    Referring MD: Rendall, Kavya M, DO   Primary Care MD: Rendall, Kavya M, DO     MRN: [id number]    HPI     Chief Complaint   Patient presents with    Initial Consult     Referred by [facility]    Back Pain     Mid to low back pain x 4 months. Upper back pain at times. Intermittent paresthesia to L lower leg at noc. Hx of scoliosis      Sarah Ball is a 28 y.o. female with scoliosis who presents with back pain.    History of Present Illness  She has had intermittent back pain for four months without a clear trigger. Pain is mainly in the lower back with some upper back involvement, worsened by carrying her book bag, Pilates, and twisting movements, and can persist up to two days after strain. It is worse at night.    She denies leg pain but has occasional numbness on the left medial shin at night when trying to sleep, which improves with leg elevation. Twisting her body is painful.    She had juvenile scoliosis that has not been evaluated since age [age]. She uses ibuprofen, which improves the pain.    Denies bowel/bladder dysfunction, saddle paresthesia, difficulty walking.    PMH/PSH/FH/Social (relevant)   scoliosis    Medications (relevant)   ibuprofen    Physical Examination   VITAL SIGNS:  There were no vitals filed for this visit.    Neurologic Exam:  Straight Leg Raise: Negative, bilaterally  Tenderness to Palpation: None  LS ROM: intact with flexion, extension, and twisting- pain with extension/twisting    Motor Exam:                            Roots Muscles Action Right Left   L2-3 Iliopsoas Hip flexion 5 5   L3-4 Quadriceps Knee extension 5 5   L5-S1 Hamstring Knee flexion 5 5   L4-5 Tibialis Anterior Foot dorsiflexion 5 5   S1 Gastrocsoleus Plantarflexion 5 5   L5 EHL Toe dorsiflexion 5 5     DTRs:       Right Left   Patellar 2+ 2+   Achilles  2+ 2+     Sensory Exam: Normal to LT throughout  Gait, station:   Normal      Radiology Interpretation   No results  found.    Impression & Plan   28 y.o. female with scoliosis who presents with back pain.  Assessment & Plan  Chronic low back pain with suspected facet joint arthritis due to pain with rotation and extension. Neuro intact on exam.  - Ordered lumbar spine x-ray to assess for structural issues.  - Initiated physical therapy for six weeks.  - Continue ibuprofen for pain management.  - Prescribed Robaxin  as a muscle relaxer.    Juvenile idiopathic scoliosis, thoracolumbar region  Juvenile idiopathic scoliosis with no recent evaluations since age 40. Family history of scoliosis noted.  - Ordered scoliosis x-ray to evaluate current status.  - Initiated physical therapy to maintain core strength and posture.    Orders Placed This Encounter    XR Lumbar Spine AP Lateral Flexion And Extension    XR Scoliosis AP And Lateral Standing    Ambulatory referral to Physical Therapy    methocarbamol  (ROBAXIN ) 500 MG tablet  Follow-up   8-10 weeks after PT and XRs      Sarah GORMAN Cramp, FNP    This note was partially generated by [employer], with the patient providing verbal consent to be recorded.

## 2024-07-15 ENCOUNTER — Encounter (INDEPENDENT_AMBULATORY_CARE_PROVIDER_SITE_OTHER): Payer: Self-pay

## 2024-07-15 ENCOUNTER — Ambulatory Visit (INDEPENDENT_AMBULATORY_CARE_PROVIDER_SITE_OTHER): Payer: BLUE CROSS/BLUE SHIELD

## 2024-07-15 ENCOUNTER — Ambulatory Visit
Admission: RE | Admit: 2024-07-15 | Discharge: 2024-07-15 | Disposition: A | Discharge status: Home / Self Care | Payer: BLUE CROSS/BLUE SHIELD | Source: Ambulatory Visit

## 2024-07-15 VITALS — BP 119/79 | HR 89 | Temp 97.8°F | Resp 17 | Ht 62.0 in | Wt 165.9 lb

## 2024-07-15 DIAGNOSIS — H6993 Unspecified Eustachian tube disorder, bilateral: Secondary | ICD-10-CM

## 2024-07-15 DIAGNOSIS — J3089 Other allergic rhinitis: Secondary | ICD-10-CM

## 2024-07-15 DIAGNOSIS — L272 Dermatitis due to ingested food: Secondary | ICD-10-CM

## 2024-07-15 DIAGNOSIS — R053 Chronic cough: Secondary | ICD-10-CM | POA: Insufficient documentation

## 2024-07-15 DIAGNOSIS — R058 Other specified cough: Secondary | ICD-10-CM

## 2024-07-15 MED ORDER — EPINEPHRINE 0.3 MG/0.3ML IJ SOAJ: 0.3000 mg | Freq: Once | INTRAMUSCULAR | 0 refills | Status: AC

## 2024-07-15 MED ORDER — PREDNISONE 10 MG PO TABS: 10.0000 mg | ORAL_TABLET | Freq: Every day | ORAL | 0 refills | Status: AC

## 2024-07-15 MED ORDER — FLUTICASONE PROPIONATE 50 MCG/ACT NA SUSP: 1.0000 | Freq: Every day | NASAL | 1 refills | Status: AC

## 2024-07-15 MED ORDER — BENZONATATE 100 MG PO CAPS: 100.0000 mg | ORAL_CAPSULE | Freq: Three times a day (TID) | ORAL | 2 refills | Status: AC | PRN

## 2024-07-15 MED ORDER — GUAIFENESIN ER 600 MG PO TB12: 1200.0000 mg | ORAL_TABLET | Freq: Two times a day (BID) | ORAL | 0 refills | Status: AC

## 2024-07-15 NOTE — Progress Notes (Signed)
 [facility] [place]   Acute Visit Template                    Date of Exam: 07/15/2024 7:02 PM        Patient ID: Sarah Ball is a 28 y.o. female.  Attending Physician: Ali Zarandazchi, MD        Chief Complaint:    Chief Complaint   Patient presents with    Cough     Persistent cough for about x4 weeks with some congestion. Mucus is clear.Patient has tried taking OTC counter medicine Alka selzer cold and flu. Took a at home covid and flu test in which was negative.         Assessment / Plan :    1. Food allergic skin reaction  - EPINEPHrine  0.3 MG/0.3ML auto-injector; Inject 0.3 mLs (0.3 mg) into the muscle once  Dispense: 0.3 mL; Refill: 0    2. Chronic cough  - XR Chest 2 Views    3. Post-viral cough syndrome    4. Dysfunction of both eustachian tubes    5. Seasonal allergic rhinitis due to other allergic trigger         Assessment & Plan  Post-viral cough  Cough persisting for four weeks, worsening over the past four days, with mucus production and chest involvement. No fever reported. Negative COVID and flu tests four weeks ago. Differential includes post-viral cough and possible pneumonia. Post-viral cough can last up to six weeks post-infection. Chest x-ray needed to rule out pneumonia.  - Ordered chest x-ray to rule out pneumonia  - Prescribed Mucinex  1200 mg tablets for two weeks to thin mucus  - Prescribed Tessalon  as needed to suppress cough  - Prednisone  10mg  daily for 5 days, patient has performance in 2 days and needs to be better quick  - Advised to report if fever develops or if symptoms do not improve in two weeks    Allergic rhinitis with nasal congestion  Chronic nasal congestion managed with ipratropium (Atrovent ) nasal spray. Flonase  recommended as a more effective alternative.  - Recommended switching to Flonase  nasal spray for better management of nasal congestion    Eustachian tube dysfunction with effusion  Fluid behind the ears causing congestion sensation. No signs  of infection. Symptoms likely related to post-viral cough and allergic rhinitis.  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 Alternative antibiotics considered if pneumonia is confirmed.  - Avoid azithromycin  - If pneumonia is confirmed, will prescribe Augmentin  and doxycycline as alternatives         HPI:         History of Present Illness  Sarah Ball is a 28 year old female who presents with a persistent cough and congestion.    She has been experiencing a cough for the past four weeks, which has worsened over the last four days. The cough is described as being 'all in my chest' and is accompanied by mucus production. Initially, she had a sore throat, but it is not currently present. Over-the-counter cold and flu medications have been used intermittently, providing temporary relief, but symptoms return after stopping the medication.    She reports significant nasal congestion and ear congestion, with occasional ear pain, particularly in one ear. She has not measured her temperature but does not believe she has had a fever. She uses a nasal spray prescribed by her allergist, identified  as Ipratropium, for her nasal congestion. She does not believe she has had a fever and does not currently have ear pain. She reports significant nasal congestion and a feeling of ear fullness. No reflux or burning in throat.    She has a history of dust allergy and is currently working with an proofreader. She denies any history of asthma or smoking. She is allergic to red dye 40 and azithromycin, with azithromycin causing anaphylaxis. She requires a refill of her EpiPen .    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        The following sections were reviewed this encounter by the provider:                Vital Signs:    BP  119/79 (BP Site: Left arm, Patient Position: Sitting, Cuff Size: Large)   Pulse 89   Temp 97.8 F (36.6 C) (Temporal)   Resp 17   Ht 1.575 m (5' 2)   Wt 75.3 kg (165 lb 14.4 oz)   LMP 07/01/2024 (Approximate)   SpO2 99%   BMI 30.34 kg/m          Physical Exam/Results:    Physical Exam  Constitutional:       General: She is not in acute distress.     Appearance: Normal appearance. She is not ill-appearing.   HENT:      Head: Normocephalic and atraumatic.      Right Ear: External ear normal.      Left Ear: External ear normal.      Nose: Nose normal. No congestion.      Mouth/Throat:      Mouth: Mucous membranes are moist.      Pharynx: Oropharynx is clear.   Eyes:      General:         Right eye: No discharge.         Left eye: No discharge.      Extraocular Movements: Extraocular movements intact.      Conjunctiva/sclera: Conjunctivae normal.   Cardiovascular:      Rate and Rhythm: Normal rate and regular rhythm.      Heart sounds: Normal heart sounds. No murmur heard.  Pulmonary:      Effort: Pulmonary effort is normal. No respiratory distress.      Breath sounds: Normal breath sounds. No wheezing.   Abdominal:      Palpations: Abdomen is soft.      Tenderness: There is no abdominal tenderness.   Musculoskeletal:      Cervical back: Normal range of motion.   Skin:     General: Skin is warm and dry.   Neurological:      Mental Status: She is alert and oriented to person, place, and time. Mental status is at baseline.   Psychiatric:         Mood and Affect: Mood normal.         Behavior: Behavior normal.         Thought Content: Thought content normal.           Physical Exam  HEENT: Fluid behind tympanic membrane, no infection. Tonsils not red or inflamed. Nasal congestion present.  NECK: No cervical lymphadenopathy.  CARDIOVASCULAR: Heart sounds normal.    Results             Follow-up:    No follow-ups on  file.         Ali Zarandazchi, MD

## 2024-07-16 ENCOUNTER — Ambulatory Visit (INDEPENDENT_AMBULATORY_CARE_PROVIDER_SITE_OTHER): Payer: Self-pay

## 2024-08-01 ENCOUNTER — Encounter: Payer: Self-pay | Admitting: Neurology

## 2024-08-01 ENCOUNTER — Ambulatory Visit: Payer: BLUE CROSS/BLUE SHIELD | Attending: Neurology | Admitting: Neurology

## 2024-08-01 VITALS — Ht 62.0 in | Wt 158.0 lb

## 2024-08-01 DIAGNOSIS — G43009 Migraine without aura, not intractable, without status migrainosus: Secondary | ICD-10-CM

## 2024-08-01 DIAGNOSIS — G935 Compression of brain: Secondary | ICD-10-CM

## 2024-08-01 MED ORDER — SUMATRIPTAN SUCCINATE 50 MG PO TABS: 50.0000 mg | ORAL_TABLET | ORAL | 3 refills | Status: AC | PRN

## 2024-08-01 NOTE — Progress Notes (Signed)
 Subjective:           Patient ID: Sarah Ball is a 29 y.o. female here for Migraine      Migraine         29 y.o. female with PMH of anti-NMDA R encephalitis in 2014 here for further evaluation of Multiple sx including:    Headaches have improved, only once/week.    Migraines since Oct  Occurrs 2 times/week, lasting for few hours --> 2-3/mth  biFrontal pain, Intensity 8/10  +photophobia, nausea, generalized weakness, lightheaded, tinnitis X2  Denies rhinorrhea, lacrimation, conj injection, sensory loss, diplopia, dysarthria  Claritin may have exacerbated lightheadedness, did better with children's claritin.  Ibuprofen with some relief.  Sleep: 4-5 hours/night last year, improved to 7 hours this year which may be correlated to improvement in headache  Mood: none  Caffeine: none  Previous meds:    2. Tingling on tip of tongue and lower lip occurs daily for the last 1.5 mths, previously less frequent.  This can occur off and on throughout the day.  No other sensory loss throughout the body  Children's claritin may help.  --> resolved    3. Lightheadedness occurs few times/mth, can last for a few hours.  She may feel generalized weakness prior to sx onset with some ill feelings.   No CP, palpitations, SOB, diaphoresis, dizziness/vertigo, N/V.  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    Generalized weakness may be improved with better sleep and headache control.    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  Reports intermittent rash on chest/abdomen        Review of Systems All systems were reviewed and were negative except as described in the HPI.    Current/Home Medications    BENZONATATE  (TESSALON ) 100 MG CAPSULE    Take 1 capsule (100 mg) by mouth 3 (three) times daily as needed for Cough    CLOBETASOL  (TEMOVATE ) 0.05 % OINTMENT    Apply topically 2 (two) times daily    EPINEPHRINE  0.3 MG/0.3ML AUTO-INJECTOR    Inject 0.3 mLs (0.3 mg) into the muscle  once    FERROUS SULFATE (IRON PO)    Take 125 mg by mouth daily Slow release    FLUTICASONE  (FLONASE ) 50 MCG/ACT NASAL SPRAY    1 spray by Nasal route once daily    GUAIFENESIN  (MUCINEX ) 600 MG 12 HR TABLET    Take 2 tablets (1,200 mg) by mouth 2 (two) times daily    IPRATROPIUM (ATROVENT ) 0.03 % NASAL SPRAY    SPRAY 2 SPRAYS BY NASAL ROUTE TWICE A DAY AS NEEDED FOR RHINITIS    NORETHIN  ACE-ETH ESTRAD-FE (BLISOVI  24 FE) 1-20 MG-MCG(24) PER TABLET    Take 1 tablet by mouth once daily    NYSTATIN  (MYCOSTATIN ) CREAM    Apply topically as needed (vulvar itching)    PREDNISONE  (DELTASONE ) 10 MG TABLET    Take 1 tablet (10 mg) by mouth once daily    RIBOFLAVIN  400 MG CAP    Take 1 capsule (400 mg) by mouth daily    VITAMIN D  PO    Take 2,000 IU by mouth every other day       The following portions of the patient's history were reviewed and updated as appropriate: allergies, current medications, past family history, past medical history, past social history, past surgical history and problem list.  No pertinent family history, except what is stated in HPI.  Objective:      Physical Exam Neurologic Exam    On exam, the patient's mental status appeared normal. Speech showed appropriate content, was fluent, and the patient followed commands. Attention and concentration appeared normal. Cranial nerves showed, full extraocular movements, midline tongue, and a symmetric face. Motor exam showed good movement in all extremities without obvious focality or weakness.      Imaging/Testing:  12/2021 MRI brain w/wo: 1.  Mild white matter changes in the frontal lobes are nonspecific but  can be seen in the setting of migraine headaches.  2.  Mild Chiari one malformation ~45mm.      Assessment:         28 y.o. female with PMH of anti-NMDA R encephalitis in 2014 here for further evaluation of Multiple sx including headaches, paresthesias, and lightheadedness.    MRI with mild nonspecific WM lesions, like normal with her history.   There is a mild Chiari malformation which we an follow with serialimaging.          Plan:        Migraines  - MRI brain with chiari malformation  - cont Riboflavin  supplementation for headache prevention, possible rash with Magnesium  - recommend Imitrex  PRN migraines  -Patient was counseled to follow the SEEDS for success in headache management, including Sleep hygiene, Exercising regularly, Eating healthy and regular meals, Drinking water, keeping a headache Diary, and Stress reduction.    2. Lightheadedness also may occur in the setting of anxiety  - MRI brain w/wo contrast wnl  - consider cardiology evaluation    4. Chiari malformation  - repeat MRI in 1 year      Sarah Phlegm, MD

## 2024-08-23 ENCOUNTER — Ambulatory Visit: Payer: BLUE CROSS/BLUE SHIELD

## 2025-02-14 ENCOUNTER — Encounter (INDEPENDENT_AMBULATORY_CARE_PROVIDER_SITE_OTHER): Payer: BLUE CROSS/BLUE SHIELD | Admitting: Family Medicine
